# Patient Record
Sex: Male | Born: 1985 | Race: Black or African American | Hispanic: No | Marital: Single | Smoking: Current every day smoker
Health system: Southern US, Community
[De-identification: ages and names within clinical notes are randomized; demographics above are authoritative.]

## PROBLEM LIST (undated history)

## (undated) HISTORY — PX: TESTICLE REMOVAL: SHX68

---
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
## 2007-10-20 ENCOUNTER — Emergency Department: Payer: Self-pay | Admitting: Emergency Medicine

## 2008-08-04 ENCOUNTER — Ambulatory Visit: Payer: Self-pay | Admitting: Urology

## 2009-08-04 ENCOUNTER — Emergency Department: Payer: Self-pay | Admitting: Emergency Medicine

## 2009-08-07 ENCOUNTER — Emergency Department: Payer: Self-pay | Admitting: Emergency Medicine

## 2010-05-29 ENCOUNTER — Emergency Department: Payer: Self-pay | Admitting: Emergency Medicine

## 2010-08-17 ENCOUNTER — Emergency Department: Payer: Self-pay | Admitting: Emergency Medicine

## 2011-07-13 ENCOUNTER — Emergency Department: Payer: Self-pay | Admitting: *Deleted

## 2011-07-13 LAB — COMPREHENSIVE METABOLIC PANEL
Albumin: 4.1 g/dL (ref 3.4–5.0)
Alkaline Phosphatase: 91 U/L (ref 50–136)
BUN: 15 mg/dL (ref 7–18)
Calcium, Total: 8.8 mg/dL (ref 8.5–10.1)
Chloride: 106 mmol/L (ref 98–107)
Creatinine: 1.2 mg/dL (ref 0.60–1.30)
Glucose: 100 mg/dL — ABNORMAL HIGH (ref 65–99)
Potassium: 3.7 mmol/L (ref 3.5–5.1)
SGOT(AST): 24 U/L (ref 15–37)
Total Protein: 7.7 g/dL (ref 6.4–8.2)

## 2011-07-13 LAB — URINALYSIS, COMPLETE
Bacteria: NONE SEEN
Bilirubin,UR: NEGATIVE
Glucose,UR: NEGATIVE mg/dL (ref 0–75)
Leukocyte Esterase: NEGATIVE
Nitrite: NEGATIVE
Ph: 5 (ref 4.5–8.0)
RBC,UR: 1 /HPF (ref 0–5)
Squamous Epithelial: <1

## 2011-07-13 LAB — CBC
MCH: 26.5 pg (ref 26.0–34.0)
MCHC: 33.1 g/dL (ref 32.0–36.0)
MCV: 80 fL (ref 80–100)
Platelet: 382 10*3/uL (ref 150–440)
RBC: 4.94 10*6/uL (ref 4.40–5.90)
RDW: 13.4 % (ref 11.5–14.5)
WBC: 9.8 10*3/uL (ref 3.8–10.6)

## 2012-11-29 ENCOUNTER — Emergency Department: Payer: Self-pay | Admitting: Emergency Medicine

## 2012-11-29 LAB — CBC WITH DIFFERENTIAL/PLATELET
Basophil #: 0.2 10*3/uL — ABNORMAL HIGH (ref 0.0–0.1)
Basophil %: 1.9 %
HCT: 40.4 % (ref 40.0–52.0)
HGB: 13.7 g/dL (ref 13.0–18.0)
Lymphocyte #: 2.4 10*3/uL (ref 1.0–3.6)
MCH: 26.7 pg (ref 26.0–34.0)
MCHC: 33.9 g/dL (ref 32.0–36.0)
MCV: 79 fL — ABNORMAL LOW (ref 80–100)
Monocyte #: 1.1 x10 3/mm — ABNORMAL HIGH (ref 0.2–1.0)
Neutrophil #: 4.5 10*3/uL (ref 1.4–6.5)
Neutrophil %: 53 %
RDW: 13.2 % (ref 11.5–14.5)
WBC: 8.5 10*3/uL (ref 3.8–10.6)

## 2012-11-29 LAB — URINALYSIS, COMPLETE
Bilirubin,UR: NEGATIVE
Blood: NEGATIVE
Glucose,UR: NEGATIVE mg/dL (ref 0–75)
Leukocyte Esterase: NEGATIVE
Nitrite: NEGATIVE
Ph: 5 (ref 4.5–8.0)
RBC,UR: 1 /HPF (ref 0–5)
Specific Gravity: 1.023 (ref 1.003–1.030)
WBC UR: 2 /HPF (ref 0–5)

## 2012-11-29 LAB — COMPREHENSIVE METABOLIC PANEL
Albumin: 3.8 g/dL (ref 3.4–5.0)
Alkaline Phosphatase: 93 U/L
Anion Gap: 6 — ABNORMAL LOW (ref 7–16)
Bilirubin,Total: 0.5 mg/dL (ref 0.2–1.0)
Calcium, Total: 9.2 mg/dL (ref 8.5–10.1)
Chloride: 107 mmol/L (ref 98–107)
EGFR (African American): >60
EGFR (Non-African Amer.): >60
Osmolality: 277 (ref 275–301)
Potassium: 4.5 mmol/L (ref 3.5–5.1)
SGPT (ALT): 42 U/L (ref 12–78)

## 2013-01-18 ENCOUNTER — Emergency Department: Payer: Self-pay | Admitting: Emergency Medicine

## 2013-01-18 LAB — TROPONIN I: Troponin-I: <0.02 ng/mL

## 2013-01-18 LAB — COMPREHENSIVE METABOLIC PANEL WITH GFR
Albumin: 3.8 g/dL
Alkaline Phosphatase: 89 U/L
Anion Gap: 6 — ABNORMAL LOW
BUN: 18 mg/dL
Bilirubin,Total: 0.6 mg/dL
Calcium, Total: 9 mg/dL
Chloride: 107 mmol/L
Co2: 24 mmol/L
Creatinine: 0.98 mg/dL
EGFR (African American): >60
EGFR (Non-African Amer.): >60
Glucose: 105 mg/dL — ABNORMAL HIGH
Osmolality: 276
Potassium: 3.8 mmol/L
SGOT(AST): 27 U/L
SGPT (ALT): 38 U/L
Sodium: 137 mmol/L
Total Protein: 7.5 g/dL

## 2013-01-18 LAB — CBC WITH DIFFERENTIAL/PLATELET
BASOS PCT: 1.1 %
Basophil #: 0.1 10*3/uL (ref 0.0–0.1)
Eosinophil #: 0.2 10*3/uL (ref 0.0–0.7)
Eosinophil %: 2.8 %
HCT: 42.5 % (ref 40.0–52.0)
HGB: 14.2 g/dL (ref 13.0–18.0)
Lymphocyte #: 2 10*3/uL (ref 1.0–3.6)
Lymphocyte %: 27 %
MCH: 26 pg (ref 26.0–34.0)
MCHC: 33.4 g/dL (ref 32.0–36.0)
MCV: 78 fL — ABNORMAL LOW (ref 80–100)
MONOS PCT: 17.8 %
Monocyte #: 1.3 x10 3/mm — ABNORMAL HIGH (ref 0.2–1.0)
NEUTROS ABS: 3.7 10*3/uL (ref 1.4–6.5)
Neutrophil %: 51.3 %
PLATELETS: 369 10*3/uL (ref 150–440)
RBC: 5.45 10*6/uL (ref 4.40–5.90)
RDW: 13.2 % (ref 11.5–14.5)
WBC: 7.2 10*3/uL (ref 3.8–10.6)

## 2013-01-18 LAB — URINALYSIS, COMPLETE
BILIRUBIN, UR: NEGATIVE
BLOOD: NEGATIVE
Bacteria: NONE SEEN
GLUCOSE, UR: NEGATIVE mg/dL (ref 0–75)
Ketone: NEGATIVE
Leukocyte Esterase: NEGATIVE
Nitrite: NEGATIVE
PH: 5 (ref 4.5–8.0)
PROTEIN: NEGATIVE
RBC,UR: 1 /HPF (ref 0–5)
Specific Gravity: 1.025 (ref 1.003–1.030)
Squamous Epithelial: NONE SEEN

## 2013-01-18 LAB — LIPASE, BLOOD: Lipase: 126 U/L (ref 73–393)

## 2013-07-27 ENCOUNTER — Emergency Department: Payer: Self-pay | Admitting: Emergency Medicine

## 2013-07-27 LAB — COMPREHENSIVE METABOLIC PANEL
ALBUMIN: 3.9 g/dL (ref 3.4–5.0)
ANION GAP: 9 (ref 7–16)
Alkaline Phosphatase: 85 U/L
BUN: 12 mg/dL (ref 7–18)
Bilirubin,Total: 0.6 mg/dL (ref 0.2–1.0)
CALCIUM: 9 mg/dL (ref 8.5–10.1)
CHLORIDE: 104 mmol/L (ref 98–107)
CO2: 25 mmol/L (ref 21–32)
Creatinine: 1.28 mg/dL (ref 0.60–1.30)
EGFR (African American): >60
EGFR (Non-African Amer.): >60
Glucose: 109 mg/dL — ABNORMAL HIGH (ref 65–99)
OSMOLALITY: 276 (ref 275–301)
Potassium: 4.1 mmol/L (ref 3.5–5.1)
SGOT(AST): 33 U/L (ref 15–37)
SGPT (ALT): 45 U/L
SODIUM: 138 mmol/L (ref 136–145)
Total Protein: 8.4 g/dL — ABNORMAL HIGH (ref 6.4–8.2)

## 2013-07-27 LAB — CBC WITH DIFFERENTIAL/PLATELET
BASOS ABS: 0.1 10*3/uL (ref 0.0–0.1)
Basophil %: 1.3 %
EOS ABS: 0.2 10*3/uL (ref 0.0–0.7)
Eosinophil %: 3.3 %
HCT: 46.7 % (ref 40.0–52.0)
HGB: 15 g/dL (ref 13.0–18.0)
LYMPHS PCT: 37.7 %
Lymphocyte #: 2.6 10*3/uL (ref 1.0–3.6)
MCH: 25.7 pg — ABNORMAL LOW (ref 26.0–34.0)
MCHC: 32.2 g/dL (ref 32.0–36.0)
MCV: 80 fL (ref 80–100)
Monocyte #: 1 x10 3/mm (ref 0.2–1.0)
Monocyte %: 14 %
Neutrophil #: 3 10*3/uL (ref 1.4–6.5)
Neutrophil %: 43.7 %
PLATELETS: 470 10*3/uL — AB (ref 150–440)
RBC: 5.86 10*6/uL (ref 4.40–5.90)
RDW: 13.3 % (ref 11.5–14.5)
WBC: 6.9 10*3/uL (ref 3.8–10.6)

## 2013-07-27 LAB — URINALYSIS, COMPLETE
BACTERIA: NONE SEEN
BLOOD: NEGATIVE
Bilirubin,UR: NEGATIVE
Glucose,UR: NEGATIVE mg/dL (ref 0–75)
KETONE: NEGATIVE
Leukocyte Esterase: NEGATIVE
NITRITE: NEGATIVE
PH: 5 (ref 4.5–8.0)
PROTEIN: NEGATIVE
Specific Gravity: 1.025 (ref 1.003–1.030)
Squamous Epithelial: NONE SEEN
WBC UR: 24 /HPF (ref 0–5)

## 2013-07-27 LAB — LIPASE, BLOOD: Lipase: 139 U/L (ref 73–393)

## 2014-04-30 ENCOUNTER — Emergency Department: Admit: 2014-04-30 | Disposition: A | Discharge status: Home / Self Care | Payer: Self-pay | Admitting: Internal Medicine

## 2014-04-30 LAB — COMPREHENSIVE METABOLIC PANEL
ALBUMIN: 4.4 g/dL
ANION GAP: 9 (ref 7–16)
AST: 27 U/L
Alkaline Phosphatase: 85 U/L
BUN: 8 mg/dL
Bilirubin,Total: 0.8 mg/dL
CALCIUM: 9 mg/dL
CO2: 25 mmol/L
Chloride: 105 mmol/L
Creatinine: 0.98 mg/dL
EGFR (Non-African Amer.): >60
GLUCOSE: 108 mg/dL — AB
Potassium: 3.9 mmol/L
SGPT (ALT): 41 U/L
Sodium: 139 mmol/L
Total Protein: 7.6 g/dL

## 2014-04-30 LAB — CBC WITH DIFFERENTIAL/PLATELET
BASOS ABS: 0.1 10*3/uL (ref 0.0–0.1)
Basophil %: 0.7 %
Eosinophil #: 0.1 10*3/uL (ref 0.0–0.7)
Eosinophil %: 1.6 %
HCT: 43.2 % (ref 40.0–52.0)
HGB: 14.2 g/dL (ref 13.0–18.0)
LYMPHS PCT: 24.3 %
Lymphocyte #: 2.1 10*3/uL (ref 1.0–3.6)
MCH: 25.5 pg — ABNORMAL LOW (ref 26.0–34.0)
MCHC: 33 g/dL (ref 32.0–36.0)
MCV: 77 fL — ABNORMAL LOW (ref 80–100)
Monocyte #: 1.2 x10 3/mm — ABNORMAL HIGH (ref 0.2–1.0)
Monocyte %: 14.4 %
NEUTROS ABS: 5 10*3/uL (ref 1.4–6.5)
Neutrophil %: 59 %
Platelet: 390 10*3/uL (ref 150–440)
RBC: 5.57 10*6/uL (ref 4.40–5.90)
RDW: 13.5 % (ref 11.5–14.5)
WBC: 8.4 10*3/uL (ref 3.8–10.6)

## 2014-05-02 LAB — BETA STREP CULTURE(ARMC)

## 2014-09-16 ENCOUNTER — Emergency Department
Admission: EM | Admit: 2014-09-16 | Discharge: 2014-09-16 | Disposition: A | Discharge status: Home / Self Care | Payer: Self-pay | Attending: Emergency Medicine | Admitting: Emergency Medicine

## 2014-09-16 ENCOUNTER — Encounter: Payer: Self-pay | Admitting: Emergency Medicine

## 2014-09-16 ENCOUNTER — Emergency Department: Payer: Self-pay

## 2014-09-16 DIAGNOSIS — Z720 Tobacco use: Secondary | ICD-10-CM | POA: Insufficient documentation

## 2014-09-16 DIAGNOSIS — Y998 Other external cause status: Secondary | ICD-10-CM | POA: Insufficient documentation

## 2014-09-16 DIAGNOSIS — S8001XA Contusion of right knee, initial encounter: Secondary | ICD-10-CM

## 2014-09-16 DIAGNOSIS — Y9289 Other specified places as the place of occurrence of the external cause: Secondary | ICD-10-CM | POA: Insufficient documentation

## 2014-09-16 DIAGNOSIS — Y9389 Activity, other specified: Secondary | ICD-10-CM | POA: Insufficient documentation

## 2014-09-16 MED ORDER — HYDROMORPHONE HCL 1 MG/ML IJ SOLN
1.0000 mg | Freq: Once | INTRAMUSCULAR | Status: AC
  Administered 2014-09-16: 1 mg via INTRAVENOUS
  Filled 2014-09-16: qty 1

## 2014-09-16 MED ORDER — ONDANSETRON HCL 4 MG/2ML IJ SOLN
4.0000 mg | Freq: Once | INTRAMUSCULAR | Status: AC
  Administered 2014-09-16: 4 mg via INTRAVENOUS
  Filled 2014-09-16: qty 2

## 2014-09-16 MED ORDER — NAPROXEN 500 MG PO TBEC: 500.0000 mg | DELAYED_RELEASE_TABLET | Freq: Two times a day (BID) | ORAL | Status: DC

## 2014-09-16 MED ORDER — HYDROCODONE-ACETAMINOPHEN 5-325 MG PO TABS: 1.0000 | ORAL_TABLET | ORAL | Status: DC | PRN

## 2014-09-16 NOTE — Discharge Instructions (Signed)
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
## 2014-09-30 ENCOUNTER — Emergency Department: Payer: Self-pay

## 2014-09-30 ENCOUNTER — Encounter: Payer: Self-pay | Admitting: Emergency Medicine

## 2014-09-30 ENCOUNTER — Emergency Department
Admission: EM | Admit: 2014-09-30 | Discharge: 2014-09-30 | Disposition: A | Discharge status: Home / Self Care | Payer: Self-pay | Attending: Emergency Medicine | Admitting: Emergency Medicine

## 2014-09-30 DIAGNOSIS — M79606 Pain in leg, unspecified: Secondary | ICD-10-CM

## 2014-09-30 DIAGNOSIS — M25461 Effusion, right knee: Secondary | ICD-10-CM | POA: Insufficient documentation

## 2014-09-30 DIAGNOSIS — Z720 Tobacco use: Secondary | ICD-10-CM | POA: Insufficient documentation

## 2014-09-30 DIAGNOSIS — M79661 Pain in right lower leg: Secondary | ICD-10-CM | POA: Insufficient documentation

## 2014-09-30 DIAGNOSIS — M79604 Pain in right leg: Secondary | ICD-10-CM

## 2014-09-30 DIAGNOSIS — M25561 Pain in right knee: Secondary | ICD-10-CM | POA: Insufficient documentation

## 2014-09-30 MED ORDER — METHOCARBAMOL 500 MG PO TABS: 500.0000 mg | ORAL_TABLET | Freq: Four times a day (QID) | ORAL | Status: DC | PRN

## 2014-09-30 MED ORDER — DICLOFENAC SODIUM 75 MG PO TBEC: 75.0000 mg | DELAYED_RELEASE_TABLET | Freq: Two times a day (BID) | ORAL | Status: DC

## 2014-09-30 NOTE — ED Notes (Signed)
Pt reports pain and swelling to his right knee. Pt reports was seen here for the same and told he had fluid on his knee. Pt reports felt better after that but now sx's are back. Pt admits they he did not follow up.
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
## 2014-09-30 NOTE — Discharge Instructions (Signed)
Heat Therapy Heat therapy can help make painful, stiff muscles and joints feel better. Do not use heat on new injuries. Wait at least 48 hours after an injury to use heat. Do not use heat when you have aches or pains right after an activity. If you still have pain 3 hours after stopping the activity, then you may use heat. HOME CARE Wet heat pack  Soak a clean towel in warm water. Squeeze out the extra water.  Put the warm, wet towel in a plastic bag.  Place a thin, dry towel between your skin and the bag.  Put the heat pack on the area for 5 minutes, and check your skin. Your skin may be pink, but it should not be red.  Leave the heat pack on the area for 15 to 30 minutes.  Repeat this every 2 to 4 hours while awake. Do not use heat while you are sleeping. Warm water bath  Fill a tub with warm water.  Place the affected body part in the tub.  Soak the area for 20 to 40 minutes.  Repeat as needed. Hot water bottle  Fill the water bottle half full with hot water.  Press out the extra air. Close the cap tightly.  Place a dry towel between your skin and the bottle.  Put the bottle on the area for 5 minutes, and check your skin. Your skin may be pink, but it should not be red.  Leave the bottle on the area for 15 to 30 minutes.  Repeat this every 2 to 4 hours while awake. Electric heating pad  Place a dry towel between your skin and the heating pad.  Set the heating pad on low heat.  Put the heating pad on the area for 10 minutes, and check your skin. Your skin may be pink, but it should not be red.  Leave the heating pad on the area for 20 to 40 minutes.  Repeat this every 2 to 4 hours while awake.  Do not lie on the heating pad.  Do not fall asleep while using the heating pad.  Do not use the heating pad near water. GET HELP RIGHT AWAY IF:  You get blisters or red skin.  Your skin is puffy (swollen), or you lose feeling (numbness) in the affected area.  You  have any new problems.  Your problems are getting worse.  You have any questions or concerns. If you have any problems, stop using heat therapy until you see your doctor. MAKE SURE YOU:  Understand these instructions.  Will watch your condition.  Will get help right away if you are not doing well or get worse. Document Released: 03/15/2011 Document Reviewed: 02/13/2013 [facility] Patient Information 2015 [place], [place]. This information is not intended to replace advice given to you by your health care provider. Make sure you discuss any questions you have with your health care provider.  Intermittent Claudication Blockage of leg arteries results from poor circulation of blood in the leg arteries. This produces an aching, tired, and sometimes burning pain in the legs that is brought on by exercise and made better by rest. Claudication refers to the limping that happens from leg cramps. It is also referred to as Vaso-occlusive disease of the legs, arterial insufficiency of the legs, recurrent leg pain, recurrent leg cramping and calf pain with exercise.  CAUSES  This condition is due to narrowing or blockage of the arteries (muscular vessels which carry blood away from the heart  and around the body). Blockage of arteries can occur anywhere in the body. If they occur in the heart, a person may experience angina (chest pain) or even a heart attack. If they occur in the neck or the brain, a person may have a stroke. Intermittent claudication is when the blockage occurs in the legs, most commonly in the calf or the foot.  Atherosclerosis, or blockage of arteries, can occur for many reasons. Some of these are smoking, diabetes, and high cholesterol. SYMPTOMS  Intermittent claudication may occur in both legs, and it often continues to get worse over time. However, some people complain only of weakness in the legs when walking, or a feeling of "tiredness" in the buttocks. Impotence (not able to have  an erection) is an occasional complaint in men. Pain while resting is uncommon.  WHAT TO EXPECT AT [facility] PROVIDER'S OFFICE: Your medical history will be asked for and a physical examination will be performed. Medical history questions documenting claudication in detail may include:   Time pattern  Do you have leg cramps at night (nocturnal cramps)?  How often does leg pain with cramping occur?  Is it getting worse?  What is the quality of the pain?  Is the pain sharp?  Is there an aching pain with the cramps?  Aggravating factors  Is it worse after you exercise?  Is it worse after you are standing for a while?  Do you smoke? How much?  Do you drink alcohol? How much?  Are you diabetic? How well is your blood sugar controlled?  Other  What other symptoms are also present?  Has there been impotence (men)?  Is there pain in the back?  Is there a darkening of the skin of the legs, feet or toes?  Is there weakness or paralysis of the legs? The physical examination may include evaluation of the femoral pulse (in the groin) and the other areas where the pulse can be felt in the legs. DIAGNOSIS  Diagnostic tests that may be performed include:  Blood pressure measured in arms and legs for comparison.  Doppler ultrasonography on the legs and the heart.  Duplex Doppler/ultrasound exam of extremity to visualize arterial blood flow.  ECG- to evaluate the activity of your heart.  Aortography- to visualize blockages in your arteries. TREATMENT Surgical treatment may be suggested if claudication interferes with the patient's activities or work, and if the diseased arteries do not seem to be improving after treatment. Be aware that this condition can worsen over time and you should carefully monitor your condition. HOME CARE INSTRUCTIONS  Talk to your caregiver about the cause of your leg cramping and about what to do at home to relieve it.  A healthy diet is  important to lessen the likeliness of atherosclerosis.  A program of daily walking for short periods, and stopping for pain or cramping, may help improve function.  It is important to stop smoking.  Avoid putting hot or cold items on legs.  Avoid tight shoes. SEEK MEDICAL CARE IF: There are many other causes of leg pain such as arthritis or low blood potassium. However, some causes of leg pain may be life threatening such as a blood clot in the legs. Seek medical attention if you have:  Leg pain that does not go away.  Legs that may be red, hot or swollen.  Ulcers or sores appear on your ankle or foot.  Any chest pain or shortness of breath accompanying leg pain.  Diabetes.  You are pregnant. SEEK IMMEDIATE MEDICAL CARE IF:   Your leg pain becomes severe or will not go away.  Your foot turns blue or a dark color.  Your leg becomes red, hot or swollen or you develop a fever over 102F.  Any chest pain or shortness of breath accompanying leg pain. MAKE SURE YOU:   Understand these instructions.  Will watch your condition.  Will get help right away if you are not doing well or get worse. Document Released: 10/24/2003 Document Revised: 03/15/2011 Document Reviewed: 03/29/2013 [facility] Patient Information 2015 [place], [place]. This information is not intended to replace advice given to you by your health care provider. Make sure you discuss any questions you have with your health care provider.  Musculoskeletal Pain Musculoskeletal pain is muscle and boney aches and pains. These pains can occur in any part of the body. Your caregiver may treat you without knowing the cause of the pain. They may treat you if blood or urine tests, X-rays, and other tests were normal.  CAUSES There is often not a definite cause or reason for these pains. These pains may be caused by a type of germ (virus). The discomfort may also come from overuse. Overuse includes working out too hard when your  body is not fit. Boney aches also come from weather changes. Bone is sensitive to atmospheric pressure changes. HOME CARE INSTRUCTIONS   Ask when your test results will be ready. Make sure you get your test results.  Only take over-the-counter or prescription medicines for pain, discomfort, or fever as directed by your caregiver. If you were given medications for your condition, do not drive, operate machinery or power tools, or sign legal documents for 24 hours. Do not drink alcohol. Do not take sleeping pills or other medications that may interfere with treatment.  Continue all activities unless the activities cause more pain. When the pain lessens, slowly resume normal activities. Gradually increase the intensity and duration of the activities or exercise.  During periods of severe pain, bed rest may be helpful. Lay or sit in any position that is comfortable.  Putting ice on the injured area.  Put ice in a bag.  Place a towel between your skin and the bag.  Leave the ice on for 15 to 20 minutes, 3 to 4 times a day.  Follow up with your caregiver for continued problems and no reason can be found for the pain. If the pain becomes worse or does not go away, it may be necessary to repeat tests or do additional testing. Your caregiver may need to look further for a possible cause. SEEK IMMEDIATE MEDICAL CARE IF:  You have pain that is getting worse and is not relieved by medications.  You develop chest pain that is associated with shortness or breath, sweating, feeling sick to your stomach (nauseous), or throw up (vomit).  Your pain becomes localized to the abdomen.  You develop any new symptoms that seem different or that concern you. MAKE SURE YOU:   Understand these instructions.  Will watch your condition.  Will get help right away if you are not doing well or get worse. Document Released: 12/21/2004 Document Revised: 03/15/2011 Document Reviewed: 08/25/2012 [facility] Patient  Information 2015 [place], [place]. This information is not intended to replace advice given to you by your health care provider. Make sure you discuss any questions you have with your health care provider.
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
## 2015-07-09 ENCOUNTER — Emergency Department: Payer: Self-pay

## 2015-07-09 ENCOUNTER — Emergency Department
Admission: EM | Admit: 2015-07-09 | Discharge: 2015-07-09 | Disposition: A | Discharge status: Home / Self Care | Payer: Self-pay | Attending: Emergency Medicine | Admitting: Emergency Medicine

## 2015-07-09 DIAGNOSIS — Y929 Unspecified place or not applicable: Secondary | ICD-10-CM | POA: Insufficient documentation

## 2015-07-09 DIAGNOSIS — F17200 Nicotine dependence, unspecified, uncomplicated: Secondary | ICD-10-CM | POA: Insufficient documentation

## 2015-07-09 DIAGNOSIS — Z8719 Personal history of other diseases of the digestive system: Secondary | ICD-10-CM | POA: Insufficient documentation

## 2015-07-09 DIAGNOSIS — S39012A Strain of muscle, fascia and tendon of lower back, initial encounter: Secondary | ICD-10-CM | POA: Insufficient documentation

## 2015-07-09 DIAGNOSIS — Y939 Activity, unspecified: Secondary | ICD-10-CM | POA: Insufficient documentation

## 2015-07-09 DIAGNOSIS — Y999 Unspecified external cause status: Secondary | ICD-10-CM | POA: Insufficient documentation

## 2015-07-09 DIAGNOSIS — Z791 Long term (current) use of non-steroidal anti-inflammatories (NSAID): Secondary | ICD-10-CM | POA: Insufficient documentation

## 2015-07-09 DIAGNOSIS — X58XXXA Exposure to other specified factors, initial encounter: Secondary | ICD-10-CM | POA: Insufficient documentation

## 2015-07-09 LAB — URINALYSIS COMPLETE WITH MICROSCOPIC (ARMC ONLY)
Bacteria, UA: NONE SEEN
Bilirubin Urine: NEGATIVE
Glucose, UA: NEGATIVE mg/dL
Hgb urine dipstick: NEGATIVE
KETONES UR: NEGATIVE mg/dL
Leukocytes, UA: NEGATIVE
Nitrite: NEGATIVE
PROTEIN: NEGATIVE mg/dL
SPECIFIC GRAVITY, URINE: 1.016 (ref 1.005–1.030)
SQUAMOUS EPITHELIAL / LPF: NONE SEEN
pH: 5 (ref 5.0–8.0)

## 2015-07-09 LAB — COMPREHENSIVE METABOLIC PANEL
ALK PHOS: 64 U/L (ref 38–126)
ALT: 20 U/L (ref 17–63)
ANION GAP: 5 (ref 5–15)
AST: 18 U/L (ref 15–41)
Albumin: 4.1 g/dL (ref 3.5–5.0)
BILIRUBIN TOTAL: 0.6 mg/dL (ref 0.3–1.2)
BUN: 15 mg/dL (ref 6–20)
CO2: 26 mmol/L (ref 22–32)
Calcium: 9.1 mg/dL (ref 8.9–10.3)
Chloride: 106 mmol/L (ref 101–111)
Creatinine, Ser: 1.02 mg/dL (ref 0.61–1.24)
GLUCOSE: 93 mg/dL (ref 65–99)
POTASSIUM: 3.9 mmol/L (ref 3.5–5.1)
Sodium: 137 mmol/L (ref 135–145)
Total Protein: 6.8 g/dL (ref 6.5–8.1)

## 2015-07-09 MED ORDER — KETOROLAC TROMETHAMINE 60 MG/2ML IM SOLN
30.0000 mg | Freq: Once | INTRAMUSCULAR | Status: AC
  Administered 2015-07-09: 30 mg via INTRAMUSCULAR
  Filled 2015-07-09: qty 2

## 2015-07-09 MED ORDER — KETOROLAC TROMETHAMINE 10 MG PO TABS: 10.0000 mg | ORAL_TABLET | Freq: Three times a day (TID) | ORAL | Status: DC

## 2015-07-09 MED ORDER — CYCLOBENZAPRINE HCL 5 MG PO TABS: 5.0000 mg | ORAL_TABLET | Freq: Three times a day (TID) | ORAL | Status: DC | PRN

## 2015-07-09 MED ORDER — CYCLOBENZAPRINE HCL 10 MG PO TABS
10.0000 mg | ORAL_TABLET | Freq: Once | ORAL | Status: AC
  Administered 2015-07-09: 10 mg via ORAL
  Filled 2015-07-09: qty 1

## 2015-07-09 NOTE — ED Notes (Signed)
Pt presents with bilateral lower back pain x 3-4 weeks.Pt denies denies any known trauma or injury, states it just started hurting one day about 3 1/2 weeks ago when he "stepped out of bed and it just started hurting". Pt denies any loss or motor function or sensation i the lower extremities. Pt denies any change in bowel or bladder habits, but does mention he noticed some blood in his stool 4 days ago.

## 2015-07-09 NOTE — Discharge Instructions (Signed)
Take the anti-inflammatory & muscle relaxant medicines as directed. Apply ice to reduce symptoms. Follow-up with [facility] for ongoing management. Consider seeing Dr. Vira Agar for issues related to your stomach and colon (rectum).   Back Injury Prevention Back injuries can be very painful. They can also be difficult to heal. After having one back injury, you are more likely to injure your back again. It is important to learn how to avoid injuring or re-injuring your back. The following tips can help you to prevent a back injury. WHAT SHOULD I KNOW ABOUT PHYSICAL FITNESS?  Exercise for 30 minutes per day on most days of the week or as directed by your health care provider. Make sure to:  Do aerobic exercises, such as walking, jogging, biking, or swimming.  Do exercises that increase balance and strength, such as tai chi and yoga. These can decrease your risk of falling and injuring your back.  Do stretching exercises to help with flexibility.  Try to develop strong abdominal muscles. Your abdominal muscles provide a lot of the support that is needed by your back.  Maintain a healthy weight. This helps to decrease your risk of a back injury. WHAT SHOULD I KNOW ABOUT MY DIET?  Talk with your health care provider about your overall diet. Take supplements and vitamins only as directed by your health care provider.  Talk with your health care provider about how much calcium and vitamin D you need each day. These nutrients help to prevent weakening of the bones (osteoporosis). Osteoporosis can cause broken (fractured) bones, which lead to back pain.  Include good sources of calcium in your diet, such as dairy products, green leafy vegetables, and products that have had calcium added to them (fortified).  Include good sources of vitamin D in your diet, such as milk and foods that are fortified with vitamin D. WHAT SHOULD I KNOW ABOUT MY POSTURE?  Sit up straight and stand up  straight. Avoid leaning forward when you sit or hunching over when you stand.  Choose chairs that have good low-back (lumbar) support.  If you work at a desk, sit close to it so you do not need to lean over. Keep your chin tucked in. Keep your neck drawn back, and keep your elbows bent at a right angle. Your arms should look like the letter "L."  Sit high and close to the steering wheel when you drive. Add a lumbar support to your car seat, if needed.  Avoid sitting or standing in one position for very long. Take breaks to get up, stretch, and walk around at least one time every hour. Take breaks every hour if you are driving for long periods of time.  Sleep on your side with your knees slightly bent, or sleep on your back with a pillow under your knees. Do not lie on the front of your body to sleep. WHAT SHOULD I KNOW ABOUT LIFTING, TWISTING, AND REACHING? Lifting and Heavy Lifting  Avoid heavy lifting, especially repetitive heavy lifting. If you must do heavy lifting:  Stretch before lifting.  Work slowly.  Rest between lifts.  Use a tool such as a cart or a dolly to move objects if one is available.  Make several small trips instead of carrying one heavy load.  Ask for help when you need it, especially when moving big objects.  Follow these steps when lifting:  Stand with your feet shoulder-width apart.  Get as close to the object as you can. Do not  try to pick up a heavy object that is far from your body.  Use handles or lifting straps if they are available.  Bend at your knees. Squat down, but keep your heels off the floor.  Keep your shoulders pulled back, your chin tucked in, and your back straight.  Lift the object slowly while you tighten the muscles in your legs, abdomen, and buttocks. Keep the object as close to the center of your body as possible.  Follow these steps when putting down a heavy load:  Stand with your feet shoulder-width apart.  Lower the object  slowly while you tighten the muscles in your legs, abdomen, and buttocks. Keep the object as close to the center of your body as possible.  Keep your shoulders pulled back, your chin tucked in, and your back straight.  Bend at your knees. Squat down, but keep your heels off the floor.  Use handles or lifting straps if they are available. Twisting and Reaching  Avoid lifting heavy objects above your waist.  Do not twist at your waist while you are lifting or carrying a load. If you need to turn, move your feet.  Do not bend over without bending at your knees.  Avoid reaching over your head, across a table, or for an object on a high surface. WHAT ARE SOME OTHER TIPS?  Avoid wet floors and icy ground. Keep sidewalks clear of ice to prevent falls.  Do not sleep on a mattress that is too soft or too hard.  Keep items that are used frequently within easy reach.  Put heavier objects on shelves at waist level, and put lighter objects on lower or higher shelves.  Find ways to decrease your stress, such as exercise, massage, or relaxation techniques. Stress can build up in your muscles. Tense muscles are more vulnerable to injury.  Talk with your health care provider if you feel anxious or depressed. These conditions can make back pain worse.  Wear flat heel shoes with cushioned soles.  Avoid sudden movements.  Use both shoulder straps when carrying a backpack.  Do not use any tobacco products, including cigarettes, chewing tobacco, or electronic cigarettes. If you need help quitting, ask your health care provider.   This information is not intended to replace advice given to you by your health care provider. Make sure you discuss any questions you have with your health care provider.   Document Released: 01/29/2004 Document Revised: 05/07/2014 Document Reviewed: 12/25/2013 Elsevier Interactive Patient Education 2016 [place] Injury Prevention Back injuries can be very  painful. They can also be difficult to heal. After having one back injury, you are more likely to injure your back again. It is important to learn how to avoid injuring or re-injuring your back. The following tips can help you to prevent a back injury. WHAT SHOULD I KNOW ABOUT PHYSICAL FITNESS?  Exercise for 30 minutes per day on most days of the week or as directed by your health care provider. Make sure to:  Do aerobic exercises, such as walking, jogging, biking, or swimming.  Do exercises that increase balance and strength, such as tai chi and yoga. These can decrease your risk of falling and injuring your back.  Do stretching exercises to help with flexibility.  Try to develop strong abdominal muscles. Your abdominal muscles provide a lot of the support that is needed by your back.  Maintain a healthy weight. This helps to decrease your risk of a back injury. WHAT SHOULD  I KNOW ABOUT MY DIET?  Talk with your health care provider about your overall diet. Take supplements and vitamins only as directed by your health care provider.  Talk with your health care provider about how much calcium and vitamin D you need each day. These nutrients help to prevent weakening of the bones (osteoporosis). Osteoporosis can cause broken (fractured) bones, which lead to back pain.  Include good sources of calcium in your diet, such as dairy products, green leafy vegetables, and products that have had calcium added to them (fortified).  Include good sources of vitamin D in your diet, such as milk and foods that are fortified with vitamin D. WHAT SHOULD I KNOW ABOUT MY POSTURE?  Sit up straight and stand up straight. Avoid leaning forward when you sit or hunching over when you stand.  Choose chairs that have good low-back (lumbar) support.  If you work at a desk, sit close to it so you do not need to lean over. Keep your chin tucked in. Keep your neck drawn back, and keep your elbows bent at a right  angle. Your arms should look like the letter "L."  Sit high and close to the steering wheel when you drive. Add a lumbar support to your car seat, if needed.  Avoid sitting or standing in one position for very long. Take breaks to get up, stretch, and walk around at least one time every hour. Take breaks every hour if you are driving for long periods of time.  Sleep on your side with your knees slightly bent, or sleep on your back with a pillow under your knees. Do not lie on the front of your body to sleep. WHAT SHOULD I KNOW ABOUT LIFTING, TWISTING, AND REACHING? Lifting and Heavy Lifting  Avoid heavy lifting, especially repetitive heavy lifting. If you must do heavy lifting:  Stretch before lifting.  Work slowly.  Rest between lifts.  Use a tool such as a cart or a dolly to move objects if one is available.  Make several small trips instead of carrying one heavy load.  Ask for help when you need it, especially when moving big objects.  Follow these steps when lifting:  Stand with your feet shoulder-width apart.  Get as close to the object as you can. Do not try to pick up a heavy object that is far from your body.  Use handles or lifting straps if they are available.  Bend at your knees. Squat down, but keep your heels off the floor.  Keep your shoulders pulled back, your chin tucked in, and your back straight.  Lift the object slowly while you tighten the muscles in your legs, abdomen, and buttocks. Keep the object as close to the center of your body as possible.  Follow these steps when putting down a heavy load:  Stand with your feet shoulder-width apart.  Lower the object slowly while you tighten the muscles in your legs, abdomen, and buttocks. Keep the object as close to the center of your body as possible.  Keep your shoulders pulled back, your chin tucked in, and your back straight.  Bend at your knees. Squat down, but keep your heels off the floor.  Use  handles or lifting straps if they are available. Twisting and Reaching  Avoid lifting heavy objects above your waist.  Do not twist at your waist while you are lifting or carrying a load. If you need to turn, move your feet.  Do not bend over without  bending at your knees.  Avoid reaching over your head, across a table, or for an object on a high surface. WHAT ARE SOME OTHER TIPS?  Avoid wet floors and icy ground. Keep sidewalks clear of ice to prevent falls.  Do not sleep on a mattress that is too soft or too hard.  Keep items that are used frequently within easy reach.  Put heavier objects on shelves at waist level, and put lighter objects on lower or higher shelves.  Find ways to decrease your stress, such as exercise, massage, or relaxation techniques. Stress can build up in your muscles. Tense muscles are more vulnerable to injury.  Talk with your health care provider if you feel anxious or depressed. These conditions can make back pain worse.  Wear flat heel shoes with cushioned soles.  Avoid sudden movements.  Use both shoulder straps when carrying a backpack.  Do not use any tobacco products, including cigarettes, chewing tobacco, or electronic cigarettes. If you need help quitting, ask your health care provider.   This information is not intended to replace advice given to you by your health care provider. Make sure you discuss any questions you have with your health care provider.   Document Released: 01/29/2004 Document Revised: 05/07/2014 Document Reviewed: 12/25/2013 Elsevier Interactive Patient Education 2016 [employer]  Lumbosacral Strain Lumbosacral strain is a strain of any of the parts that make up your lumbosacral vertebrae. Your lumbosacral vertebrae are the bones that make up the lower third of your backbone. Your lumbosacral vertebrae are held together by muscles and tough, fibrous tissue (ligaments).  CAUSES  A sudden blow to your back can cause  lumbosacral strain. Also, anything that causes an excessive stretch of the muscles in the low back can cause this strain. This is typically seen when people exert themselves strenuously, fall, lift heavy objects, bend, or crouch repeatedly. RISK FACTORS  Physically demanding work.  Participation in pushing or pulling sports or sports that require a sudden twist of the back (tennis, golf, baseball).  Weight lifting.  Excessive lower back curvature.  Forward-tilted pelvis.  Weak back or abdominal muscles or both.  Tight hamstrings. SIGNS AND SYMPTOMS  Lumbosacral strain may cause pain in the area of your injury or pain that moves (radiates) down your leg.  DIAGNOSIS Your health care provider can often diagnose lumbosacral strain through a physical exam. In some cases, you may need tests such as X-ray exams.  TREATMENT  Treatment for your lower back injury depends on many factors that your clinician will have to evaluate. However, most treatment will include the use of anti-inflammatory medicines. HOME CARE INSTRUCTIONS   Avoid hard physical activities (tennis, racquetball, waterskiing) if you are not in proper physical condition for it. This may aggravate or create problems.  If you have a back problem, avoid sports requiring sudden body movements. Swimming and walking are generally safer activities.  Maintain good posture.  Maintain a healthy weight.  For acute conditions, you may put ice on the injured area.  Put ice in a plastic bag.  Place a towel between your skin and the bag.  Leave the ice on for 20 minutes, 2-3 times a day.  When the low back starts healing, stretching and strengthening exercises may be recommended. SEEK MEDICAL CARE IF:  Your back pain is getting worse.  You experience severe back pain not relieved with medicines. SEEK IMMEDIATE MEDICAL CARE IF:   You have numbness, tingling, weakness, or problems with the use of your  arms or legs.  There is a  change in bowel or bladder control.  You have increasing pain in any area of the body, including your belly (abdomen).  You notice shortness of breath, dizziness, or feel faint.  You feel sick to your stomach (nauseous), are throwing up (vomiting), or become sweaty.  You notice discoloration of your toes or legs, or your feet get very cold. MAKE SURE YOU:   Understand these instructions.  Will watch your condition.  Will get help right away if you are not doing well or get worse.   This information is not intended to replace advice given to you by your health care provider. Make sure you discuss any questions you have with your health care provider.   Document Released: 09/30/2004 Document Revised: 01/11/2014 Document Reviewed: 08/09/2012 Elsevier Interactive Patient Education [employer].

## 2015-07-09 NOTE — ED Notes (Signed)
Pt in with co lower back pain x 3 weeks unsure of injury. Worse when he walks or moves, unable to bend over due to pain.

## 2015-07-09 NOTE — ED Provider Notes (Signed)
[facility] Emergency Department Provider Note ____________________________________________  Time seen: 1947  I have reviewed the triage vital signs and the nursing notes.  HISTORY  Chief Complaint  Back Pain  HPI Ricky PouDominique T Manning is a 30 y.o. male presents to the ED accompanied by his mother for evaluation of low back pain has been intermittent for the last 3 weeks. Patient denies any known injury, accident, or trauma. He also denies any previous history of low back pain. He describes the pain is worse with bending, and localizes the pain to the lumbar sacral region. He notes some increased tightness across the bilateral thighs and buttocks when he bends. He does work in a heavy labor job has been there for the last 2 weeks, but denies any job-related injury. He is utilized he therapy and ibuprofen intermittently without any significant or sustained relief. He denies any bladder or bowel incontinence, foot drop, leg weakness. He does relate a history of intermittent rectal bleeding without rectal pain. When asked about bladder or bowel incontinence, he reports bright red blood on the toilet tissue that has been intermittent for the last 4 years. He does report previous ED visits and workups but admits that he did not follow-up as prescribed. He rates his pain at a 10/10 in triage. He has not sought care any other clinical provider's office.  No past medical history on file.  There are no active problems to display for this patient.  Past Surgical History  Procedure Laterality Date  . Testicle removal      Current Outpatient Rx  Name  Route  Sig  Dispense  Refill  . cyclobenzaprine (FLEXERIL) 5 MG tablet   Oral   Take 1 tablet (5 mg total) by mouth 3 (three) times daily as needed for muscle spasms.   15 tablet   0   . diclofenac (VOLTAREN) 75 MG EC tablet   Oral   Take 1 tablet (75 mg total) by mouth 2 (two) times daily.   60 tablet   0   . ketorolac  (TORADOL) 10 MG tablet   Oral   Take 1 tablet (10 mg total) by mouth every 8 (eight) hours.   15 tablet   0   . methocarbamol (ROBAXIN) 500 MG tablet   Oral   Take 1 tablet (500 mg total) by mouth every 6 (six) hours as needed for muscle spasms.   30 tablet   0    Allergies Tramadol  No family history on file.  Social History Social History  Substance Use Topics  . Smoking status: Current Every Day Smoker  . Smokeless tobacco: Not on file  . Alcohol Use: Yes   Review of Systems  Constitutional: Negative for fever. Cardiovascular: Negative for chest pain. Respiratory: Negative for shortness of breath. Gastrointestinal: Negative for abdominal pain, vomiting and diarrhea. Genitourinary: Negative for dysuria. Musculoskeletal: Positive for back pain. Skin: Negative for rash. Neurological: Negative for headaches, focal weakness or numbness. ____________________________________________  PHYSICAL EXAM:  VITAL SIGNS: ED Triage Vitals  Enc Vitals Group     BP 07/09/15 1918 164/97 mmHg     Pulse Rate 07/09/15 1917 75     Resp 07/09/15 1917 20     Temp 07/09/15 1917 97.5 F (36.4 C)     Temp Source 07/09/15 1917 Oral     SpO2 07/09/15 1917 99 %     Weight 07/09/15 1917 263 lb (119.296 kg)     Height 07/09/15 1917 5\' 7"  (1.702  m)     Head Cir --      Peak Flow --      Pain Score 07/09/15 1917 10     Pain Loc --      Pain Edu? --      Excl. in GC? --    Constitutional: Alert and oriented. Well appearing and in no distress. Head: Normocephalic and atraumatic. Cardiovascular: Normal rate, regular rhythm.  Respiratory: Normal respiratory effort. No wheezes/rales/rhonchi. Gastrointestinal: Soft and nontender. No distention, rebound or guarding. No CVA tenderness Musculoskeletal:Normal spinal on it without midline tenderness, spasm, deformity, step-off. Patient transitions from supine to sit without difficulty. He has a negative seated straight leg raise. He is able to  perform a toe walk and heel without difficulty. Lumbar flexion and extension are full without difficulty. Nontender with normal range of motion in all extremities.  Neurologic: Cranial nerves II through XII grossly intact. Normal LE DTRs bilaterally. Normal gait without ataxia. Normal speech and language. No gross focal neurologic deficits are appreciated. Skin:  Skin is warm, dry and intact. No rash noted. ____________________________________________    LABS (pertinent positives/negatives) Labs Reviewed  URINALYSIS COMPLETEWITH MICROSCOPIC ([facility] ONLY) - Abnormal; Notable for the following:    Color, Urine YELLOW (*)    APPearance CLEAR (*)    All other components within normal limits  COMPREHENSIVE METABOLIC PANEL  ____________________________________________   RADIOLOGY  Lumbar Spine  IMPRESSION: Negative exam. ____________________________________________  PROCEDURES  Toradol 30 mg IM Flexeril 10 mg PO ____________________________________________  INITIAL IMPRESSION / ASSESSMENT AND PLAN / ED COURSE  Patient with lumbar sacral strain that appears to be metallic in nature. His labs and radiology are negative for any acute findings. Patient is discharged with a prescription for ketorolac and Flexeril to dose as directed. He should follow-up with a local [facility] for ongoing symptoms. Patient is also referred to gastroenterology for further evaluation of his intermittent rectal bleeding. ____________________________________________  FINAL CLINICAL IMPRESSION(S) / ED DIAGNOSES  Final diagnoses:  Lumbar strain, initial encounter  History of rectal bleeding     Lissa HoardJenise V Bacon Connie Hilgert, PA-C 07/10/15 0012  Jeanmarie PlantJames A McShane, MD 07/18/15 1414

## 2015-07-09 NOTE — ED Notes (Signed)
Lab called to notify nursing that blood sample previously sent for CMP had hemolyzed and was unusable. New order placed and sample to be collected and sent to lab at this time.

## 2015-08-11 ENCOUNTER — Encounter: Payer: Self-pay | Admitting: Emergency Medicine

## 2015-08-11 ENCOUNTER — Emergency Department
Admission: EM | Admit: 2015-08-11 | Discharge: 2015-08-11 | Disposition: A | Discharge status: Home / Self Care | Payer: Self-pay | Attending: Emergency Medicine | Admitting: Emergency Medicine

## 2015-08-11 DIAGNOSIS — F17200 Nicotine dependence, unspecified, uncomplicated: Secondary | ICD-10-CM | POA: Insufficient documentation

## 2015-08-11 DIAGNOSIS — Y939 Activity, unspecified: Secondary | ICD-10-CM | POA: Insufficient documentation

## 2015-08-11 DIAGNOSIS — Y999 Unspecified external cause status: Secondary | ICD-10-CM | POA: Insufficient documentation

## 2015-08-11 DIAGNOSIS — S0501XA Injury of conjunctiva and corneal abrasion without foreign body, right eye, initial encounter: Secondary | ICD-10-CM | POA: Insufficient documentation

## 2015-08-11 DIAGNOSIS — W500XXA Accidental hit or strike by another person, initial encounter: Secondary | ICD-10-CM | POA: Insufficient documentation

## 2015-08-11 DIAGNOSIS — Y929 Unspecified place or not applicable: Secondary | ICD-10-CM | POA: Insufficient documentation

## 2015-08-11 MED ORDER — OXYCODONE-ACETAMINOPHEN 5-325 MG PO TABS: 1.0000 | ORAL_TABLET | ORAL | 0 refills | Status: DC | PRN

## 2015-08-11 MED ORDER — OFLOXACIN 0.3 % OP SOLN: 2.0000 [drp] | Freq: Four times a day (QID) | OPHTHALMIC | 0 refills | Status: AC

## 2015-08-11 MED ORDER — FLUORESCEIN SODIUM 1 MG OP STRP
ORAL_STRIP | OPHTHALMIC | Status: AC
  Filled 2015-08-11: qty 1

## 2015-08-11 MED ORDER — EYE WASH OPHTH SOLN
OPHTHALMIC | Status: AC
  Filled 2015-08-11: qty 118

## 2015-08-11 MED ORDER — TETRACAINE HCL 0.5 % OP SOLN
OPHTHALMIC | Status: AC
  Filled 2015-08-11: qty 2

## 2015-08-11 NOTE — ED Provider Notes (Signed)
[facility] Emergency Department Provider Note   ____________________________________________   First MD Initiated Contact with Patient 08/11/15 1424     (approximate)  I have reviewed the triage vital signs and the nursing notes.   HISTORY  Chief Complaint Eye Pain   HPI Ricky Manning is a 30 y.o. male who presents today for evaluation of right eye trauma. Patient states that this AM his daughter accidentally scratched his eye. Patient states that since the time of the incident he has had increasing blurry vision, pain, photophobia, and tearing. He denies fever, headache, nausea, or vomiting. Patient is experiencing 4/10 pain and has not taken anything for the pain. Patient states that his eye is red and it is very difficult to open his eye.  History reviewed. No pertinent past medical history.  There are no active problems to display for this patient.   Past Surgical History:  Procedure Laterality Date  . TESTICLE REMOVAL      Prior to Admission medications   Medication Sig Start Date End Date Taking? Authorizing Provider  ofloxacin (OCUFLOX) 0.3 % ophthalmic solution Place 2 drops into the right eye 4 (four) times daily. 08/11/15 08/18/15  Charmayne Sheer Beers, PA-C  oxyCODONE-acetaminophen (ROXICET) 5-325 MG tablet Take 1-2 tablets by mouth every 4 (four) hours as needed for severe pain. 08/11/15   Evangeline Dakin, PA-C    Allergies Tramadol  No family history on file.  Social History Social History  Substance Use Topics  . Smoking status: Current Every Day Smoker  . Smokeless tobacco: Never Used  . Alcohol use Yes    Review of Systems Constitutional: No fever/chills Eyes: Blurry vision, red eye, increased tear production, and eye pain ENT: No sore throat. Cardiovascular: Denies chest pain. Respiratory: Denies shortness of breath. Gastrointestinal: No abdominal pain.  No nausea, no vomiting. Skin: Negative for rash. Neurological:  Negative for headaches, focal weakness or numbness. 10-point ROS otherwise negative.  ____________________________________________   PHYSICAL EXAM:  VITAL SIGNS: ED Triage Vitals  Enc Vitals Group     BP 08/11/15 1343 (!) 142/87     Pulse Rate 08/11/15 1343 79     Resp 08/11/15 1343 18     Temp 08/11/15 1343 97.5 F (36.4 C)     Temp Source 08/11/15 1343 Oral     SpO2 08/11/15 1343 100 %     Weight 08/11/15 1343 267 lb (121.1 kg)     Height 08/11/15 1343  (1.702 m)     Head Circumference --      Peak Flow --      Pain Score 08/11/15 1347 4     Pain Loc --      Pain Edu? --      Excl. in GC? --     Constitutional: Alert and oriented. Well appearing and in no acute distress. Eyes: Injected conjunctiva and notable tearing, florescent eye stain showed corneal abrasion  Head: Atraumatic. Cardiovascular: Normal rate, regular rhythm. Grossly normal heart sounds.  Good peripheral circulation. Respiratory: Normal respiratory effort.  No retractions. Lungs CTAB. Gastrointestinal: Soft and nontender. No distention Neurologic:  Normal speech and language. No gross focal neurologic deficits are appreciated. No gait instability. Skin:  Skin is warm, dry and intact. No rash noted. Psychiatric: Mood and affect are normal. Speech and behavior are normal.  ____________________________________________   LABS (all labs ordered are listed, but only abnormal results are displayed)  Labs Reviewed - No data to display ____________________________________________  ____________________________________________   PROCEDURES  Procedure(s) performed:YES Patient's right eye was anesthetic cleaning numbing with tetracaine eyedrops. Worsened strip placed demonstrating corneal abrasion eye was copiously irrigated with eyewash completion of exam.  Procedures  Critical Care performed: No  ____________________________________________   INITIAL IMPRESSION / ASSESSMENT AND PLAN / ED  COURSE  Pertinent labs & imaging results that were available during my care of the patient were reviewed by me and considered in my medical decision making (see chart for details).  Patient was given Ofloxacin for bacterial coverage. Pain medication was supplied to patient. He is instructed to look for signs of infection and to follow up with eye doctor if symptoms worsen or persist.  Clinical Course     ____________________________________________   FINAL CLINICAL IMPRESSION(S) / ED DIAGNOSES  Final diagnoses:  Corneal abrasion, right, initial encounter      NEW MEDICATIONS STARTED DURING THIS VISIT:  Discharge Medication List as of 08/11/2015  2:49 PM    START taking these medications   Details  ofloxacin (OCUFLOX) 0.3 % ophthalmic solution Place 2 drops into the right eye 4 (four) times daily., Starting Mon 08/11/2015, Until Mon 08/18/2015, Print         Note:  This document was prepared using Dragon voice recognition software and may include unintentional dictation errors.    Evangeline Dakinharles M Beers, PA-C 08/11/15 1527    Jeanmarie PlantJames A McShane, MD 08/11/15 [phone]

## 2015-08-11 NOTE — ED Triage Notes (Addendum)
States he was poked in righ eye this am.. States he was lying in bed and child hit him with their finger  Eye is red and irritated

## 2016-01-19 ENCOUNTER — Emergency Department
Admission: EM | Admit: 2016-01-19 | Discharge: 2016-01-19 | Disposition: A | Discharge status: Home / Self Care | Payer: Self-pay | Attending: Emergency Medicine | Admitting: Emergency Medicine

## 2016-01-19 ENCOUNTER — Encounter: Payer: Self-pay | Admitting: Emergency Medicine

## 2016-01-19 DIAGNOSIS — Y990 Civilian activity done for income or pay: Secondary | ICD-10-CM | POA: Insufficient documentation

## 2016-01-19 DIAGNOSIS — Y93F2 Activity, caregiving, lifting: Secondary | ICD-10-CM | POA: Insufficient documentation

## 2016-01-19 DIAGNOSIS — S39011A Strain of muscle, fascia and tendon of abdomen, initial encounter: Secondary | ICD-10-CM | POA: Insufficient documentation

## 2016-01-19 DIAGNOSIS — X500XXA Overexertion from strenuous movement or load, initial encounter: Secondary | ICD-10-CM | POA: Insufficient documentation

## 2016-01-19 DIAGNOSIS — Y929 Unspecified place or not applicable: Secondary | ICD-10-CM | POA: Insufficient documentation

## 2016-01-19 DIAGNOSIS — F17210 Nicotine dependence, cigarettes, uncomplicated: Secondary | ICD-10-CM | POA: Insufficient documentation

## 2016-01-19 LAB — COMPREHENSIVE METABOLIC PANEL
ALBUMIN: 4.2 g/dL (ref 3.5–5.0)
ALK PHOS: 63 U/L (ref 38–126)
ALT: 14 U/L — ABNORMAL LOW (ref 17–63)
ANION GAP: 7 (ref 5–15)
AST: 19 U/L (ref 15–41)
BILIRUBIN TOTAL: 0.9 mg/dL (ref 0.3–1.2)
BUN: 14 mg/dL (ref 6–20)
CALCIUM: 9.2 mg/dL (ref 8.9–10.3)
CO2: 23 mmol/L (ref 22–32)
Chloride: 108 mmol/L (ref 101–111)
Creatinine, Ser: 1.05 mg/dL (ref 0.61–1.24)
GFR calc Af Amer: >60 mL/min (ref >60)
GFR calc non Af Amer: >60 mL/min (ref >60)
GLUCOSE: 99 mg/dL (ref 65–99)
Potassium: 4.1 mmol/L (ref 3.5–5.1)
Sodium: 138 mmol/L (ref 135–145)
TOTAL PROTEIN: 7.6 g/dL (ref 6.5–8.1)

## 2016-01-19 LAB — CBC
HCT: 43.1 % (ref 40.0–52.0)
HEMOGLOBIN: 14.2 g/dL (ref 13.0–18.0)
MCH: 25.5 pg — ABNORMAL LOW (ref 26.0–34.0)
MCHC: 33 g/dL (ref 32.0–36.0)
MCV: 77.4 fL — ABNORMAL LOW (ref 80.0–100.0)
Platelets: 393 10*3/uL (ref 150–440)
RBC: 5.57 MIL/uL (ref 4.40–5.90)
RDW: 14.2 % (ref 11.5–14.5)
WBC: 12.2 10*3/uL — ABNORMAL HIGH (ref 3.8–10.6)

## 2016-01-19 LAB — URINALYSIS, COMPLETE (UACMP) WITH MICROSCOPIC
BILIRUBIN URINE: NEGATIVE
Glucose, UA: NEGATIVE mg/dL
Hgb urine dipstick: NEGATIVE
KETONES UR: NEGATIVE mg/dL
Leukocytes, UA: NEGATIVE
NITRITE: NEGATIVE
PROTEIN: NEGATIVE mg/dL
SPECIFIC GRAVITY, URINE: 1.016 (ref 1.005–1.030)
pH: 5 (ref 5.0–8.0)

## 2016-01-19 LAB — LIPASE, BLOOD: Lipase: 21 U/L (ref 11–51)

## 2016-01-19 MED ORDER — NAPROXEN 500 MG PO TABS: 500.0000 mg | ORAL_TABLET | Freq: Two times a day (BID) | ORAL | 0 refills | Status: DC

## 2016-01-19 NOTE — ED Triage Notes (Signed)
Mid abdominal pain began 3 days ago. States has had this pain intermittent x 3 to 4 years with workups but no diagnosis. States began this time at work. States lifts heavy objects at work. Denies fevers. Rare ETOH use. Denies dysuria.

## 2016-01-19 NOTE — ED Provider Notes (Signed)
[facility] Emergency Department Provider Note  ____________________________________________  Time seen: Approximately 4:25 PM  I have reviewed the triage vital signs and the nursing notes.   HISTORY  Chief Complaint Abdominal Pain    HPI Ricky Manning is a 31 y.o. male who complains of intermittent mid abdominal pain for the past 3 days. His recurring over the last 3 or 4 years, had multiple imaging studies without any diagnoses. No nausea vomiting or diarrhea. Eating and drinking normally. No aggravating or alleviating factors. It's sharp. Feels worse when sitting upright. Does regularly lift heavy objects at work. Pain is also worsened when he strains have a bowel movement.     History reviewed. No pertinent past medical history.   There are no active problems to display for this patient.    Past Surgical History:  Procedure Laterality Date  . TESTICLE REMOVAL       Prior to Admission medications   Medication Sig Start Date End Date Taking? Authorizing Provider  naproxen (NAPROSYN) 500 MG tablet Take 1 tablet (500 mg total) by mouth 2 (two) times daily with a meal. 01/19/16   Sharman CheekPhillip Candas Deemer, MD  oxyCODONE-acetaminophen (ROXICET) 5-325 MG tablet Take 1-2 tablets by mouth every 4 (four) hours as needed for severe pain. 08/11/15   Evangeline Dakinharles M Beers, PA-C     Allergies Tramadol   No family history on file.  Social History Social History  Substance Use Topics  . Smoking status: Current Every Day Smoker    Packs/day: 1.00    Types: Cigarettes  . Smokeless tobacco: Never Used  . Alcohol use Yes    Review of Systems  Constitutional:   No fever or chills.  ENT:   No sore throat. No rhinorrhea. Cardiovascular:   No chest pain. Respiratory:   No dyspnea or cough. Gastrointestinal:   Positive as above for abdominal pain without vomiting and diarrhea.  Genitourinary:   Negative for dysuria or difficulty urinating. Musculoskeletal:    Negative for focal pain or swelling Neurological:   Negative for headaches 10-point ROS otherwise negative.  ____________________________________________   PHYSICAL EXAM:  VITAL SIGNS: ED Triage Vitals [01/19/16 1206]  Enc Vitals Group     BP (!) 149/100     Pulse Rate 72     Resp 18     Temp 97.5 F (36.4 C)     Temp Source Oral     SpO2 98 %     Weight 245 lb (111.1 kg)     Height 5\' 7"  (1.702 m)     Head Circumference      Peak Flow      Pain Score 10     Pain Loc      Pain Edu?      Excl. in GC?     Vital signs reviewed, nursing assessments reviewed.   Constitutional:   Alert and oriented. Well appearing and in no distress. Eyes:   No scleral icterus. ENT   Head:   Normocephalic and atraumatic.   Mouth/Throat:   MMM, no pharyngeal erythema. No peritonsillar mass.    Neck:   No stridor. No SubQ emphysema. No meningismus. Hematological/Lymphatic/Immunilogical:   No cervical lymphadenopathy. Cardiovascular:   RRR. Symmetric bilateral radial and DP pulses.  No murmurs.  Respiratory:   Normal respiratory effort without tachypnea nor retractions. Breath sounds are clear and equal bilaterally. No wheezes/rales/rhonchi. Gastrointestinal:   Soft with mild tenderness in the midline with superficial palpation of the abdominal wall, particularly just  superior to the umbilicus. In this area there is a subcentimeter fascial defect palpable without any bulge or mass.. Non distended. There is no CVA tenderness.  No rebound, rigidity, or guarding. Genitourinary:   deferred Musculoskeletal:   Nontender with normal range of motion in all extremities. No joint effusions.  No lower extremity tenderness.  No edema. Neurologic:   Normal speech and language.  CN 2-10 normal. Motor grossly intact. No gross focal neurologic deficits are appreciated.  Skin:    Skin is warm, dry and intact. No rash noted.  No petechiae, purpura, or  bullae.  ____________________________________________    LABS (pertinent positives/negatives) (all labs ordered are listed, but only abnormal results are displayed) Labs Reviewed  COMPREHENSIVE METABOLIC PANEL - Abnormal; Notable for the following:       Result Value   ALT 14 (*)    All other components within normal limits  CBC - Abnormal; Notable for the following:    WBC 12.2 (*)    MCV 77.4 (*)    MCH 25.5 (*)    All other components within normal limits  URINALYSIS, COMPLETE (UACMP) WITH MICROSCOPIC - Abnormal; Notable for the following:    Color, Urine YELLOW (*)    APPearance CLEAR (*)    All other components within normal limits  LIPASE, BLOOD   ____________________________________________   EKG    ____________________________________________    RADIOLOGY    ____________________________________________   PROCEDURES Procedures  ____________________________________________   INITIAL IMPRESSION / ASSESSMENT AND PLAN / ED COURSE  Pertinent labs & imaging results that were available during my care of the patient were reviewed by me and considered in my medical decision making (see chart for details).  Patient presents with abdominal pain, clinically appears to be a very small fascial defect the beginning of a ventral hernia. Counseled patient on this and avoiding heavy lifting and using a stool softener to avoid straining when he goes to the bathroom. Work note provided. NSAIDs and heat therapy for now. Follow up with primary care. Return precautions given.     Clinical Course    ____________________________________________   FINAL CLINICAL IMPRESSION(S) / ED DIAGNOSES  Final diagnoses:  Abdominal wall strain, initial encounter      New Prescriptions   NAPROXEN (NAPROSYN) 500 MG TABLET    Take 1 tablet (500 mg total) by mouth 2 (two) times daily with a meal.     Portions of this note were generated with dragon dictation software. Dictation  errors may occur despite best attempts at proofreading.    Sharman Cheek, MD 01/19/16 [phone]

## 2016-02-08 ENCOUNTER — Encounter: Payer: Self-pay | Admitting: Emergency Medicine

## 2016-02-08 ENCOUNTER — Emergency Department
Admission: EM | Admit: 2016-02-08 | Discharge: 2016-02-08 | Disposition: A | Discharge status: Home / Self Care | Payer: Self-pay | Attending: Emergency Medicine | Admitting: Emergency Medicine

## 2016-02-08 DIAGNOSIS — J111 Influenza due to unidentified influenza virus with other respiratory manifestations: Secondary | ICD-10-CM

## 2016-02-08 DIAGNOSIS — R112 Nausea with vomiting, unspecified: Secondary | ICD-10-CM | POA: Insufficient documentation

## 2016-02-08 DIAGNOSIS — R197 Diarrhea, unspecified: Secondary | ICD-10-CM | POA: Insufficient documentation

## 2016-02-08 DIAGNOSIS — F17210 Nicotine dependence, cigarettes, uncomplicated: Secondary | ICD-10-CM | POA: Insufficient documentation

## 2016-02-08 DIAGNOSIS — R69 Illness, unspecified: Secondary | ICD-10-CM

## 2016-02-08 MED ORDER — ACETAMINOPHEN-CODEINE #3 300-30 MG PO TABS: 1.0000 | ORAL_TABLET | Freq: Three times a day (TID) | ORAL | 0 refills | Status: DC | PRN

## 2016-02-08 MED ORDER — FLUTICASONE PROPIONATE 50 MCG/ACT NA SUSP: 2.0000 | Freq: Every day | NASAL | 0 refills | Status: DC

## 2016-02-08 MED ORDER — BENZONATATE 100 MG PO CAPS: 100.0000 mg | ORAL_CAPSULE | Freq: Three times a day (TID) | ORAL | 0 refills | Status: DC | PRN

## 2016-02-08 NOTE — ED Provider Notes (Signed)
[facility] Emergency Department Provider Note ____________________________________________  Time seen: 1124  I have reviewed the triage vital signs and the nursing notes.  HISTORY  Chief Complaint  Influenza  HPI Ricky Manning is a 31 y.o. male presents to the ED for evaluation of flulike symptoms. Patient describes onset of symptoms about 4 days prior. Over the last 2 days however he's had increased cough production, body aches, nausea, vomiting, and diarrhea times one. He denies any frank fevers, chills, sweats. He has been taking DayQuil and NyQuil for symptom relief. The patient reports he did not receive the seasonal flu vaccine.  History reviewed. No pertinent past medical history.  There are no active problems to display for this patient.   Past Surgical History:  Procedure Laterality Date  . TESTICLE REMOVAL      Prior to Admission medications   Medication Sig Start Date End Date Taking? Authorizing Provider  acetaminophen-codeine (TYLENOL #3) 300-30 MG tablet Take 1 tablet by mouth every 8 (eight) hours as needed for moderate pain. 02/08/16   Ronalee Scheunemann V Bacon Tykera Skates, PA-C  benzonatate (TESSALON PERLES) 100 MG capsule Take 1 capsule (100 mg total) by mouth 3 (three) times daily as needed for cough (Take 1-2 per dose). 02/08/16   Joory Gough V Bacon Montay Vanvoorhis, PA-C  fluticasone (FLONASE) 50 MCG/ACT nasal spray Place 2 sprays into both nostrils daily. 02/08/16   Xiong Haidar V Bacon Jamese Trauger, PA-C  naproxen (NAPROSYN) 500 MG tablet Take 1 tablet (500 mg total) by mouth 2 (two) times daily with a meal. 01/19/16   Sharman Cheek, MD  oxyCODONE-acetaminophen (ROXICET) 5-325 MG tablet Take 1-2 tablets by mouth every 4 (four) hours as needed for severe pain. 08/11/15   Evangeline Dakin, PA-C    Allergies Tramadol  History reviewed. No pertinent family history.  Social History Social History  Substance Use Topics  . Smoking status: Current Every Day Smoker    Packs/day:  1.00    Types: Cigarettes  . Smokeless tobacco: Never Used  . Alcohol use Yes    Review of Systems  Constitutional: Negative for fever. Eyes: Negative for visual changes. ENT: Negative for sore throat. Reports sinus drainage and congestion as above. Cardiovascular: Negative for chest pain. Respiratory: Negative for shortness of breath. Gastrointestinal: Positive for nausea, vomiting and diarrhea. Genitourinary: Negative for dysuria. Musculoskeletal: Negative for back pain. Was generalized body aches. Skin: Negative for rash. Neurological: Negative for headaches, focal weakness or numbness. ____________________________________________  PHYSICAL EXAM:  VITAL SIGNS: ED Triage Vitals [02/08/16 1037]  Enc Vitals Group     BP (!) 152/105     Pulse Rate 78     Resp      Temp 97.6 F (36.4 C)     Temp Source Oral     SpO2 96 %     Weight 250 lb (113.4 kg)     Height 5\' 6"  (1.676 m)     Head Circumference      Peak Flow      Pain Score      Pain Loc      Pain Edu?      Excl. in GC?     Constitutional: Alert and oriented. Well appearing and in no distress. Head: Normocephalic and atraumatic. Eyes: Conjunctivae are normal. PERRL. Normal extraocular movements Ears: Canals clear. TMs intact bilaterally. Nose: No congestion. Copious , cloudy rhinorrhea. No epistaxis. Mouth/Throat: Mucous membranes are moist. Uvula is midline and tonsils are flat. No oropharyngeal lesions or erythema is noted.  Neck: Supple. No thyromegaly. Hematological/Lymphatic/Immunological: No cervical lymphadenopathy. Cardiovascular: Normal rate, regular rhythm. Normal distal pulses. Respiratory: Normal respiratory effort. No wheezes/rales/rhonchi. Gastrointestinal: Soft and nontender. No distention. Musculoskeletal: Nontender with normal range of motion in all extremities.  ____________________________________________  INITIAL IMPRESSION / ASSESSMENT AND PLAN / ED COURSE  Visual symptoms are  consistent with a likely viral URI that may represent influenza. He is discharged at this time with prescriptions for Tylenol No. 3, Tessalon Perles, and Flonase. He is advised to dose over-the-counter cough and cold medicines as previous. He may add Delsym for cough relief as well. He should hydrate, rest, and follow-up with one of the local committee clinics. ____________________________________________  FINAL CLINICAL IMPRESSION(S) / ED DIAGNOSES  Final diagnoses:  Influenza-like illness     Lissa HoardJenise V Bacon Ahriana Gunkel, PA-C 02/08/16 1142    Myrna Blazeravid Matthew Schaevitz, MD 02/08/16 [phone]

## 2016-02-08 NOTE — ED Triage Notes (Signed)
Pt with body aches, cough, sore throat x 3 days. Diarrhea x 1 today. Fever unknown

## 2016-02-08 NOTE — ED Notes (Signed)

## 2016-02-08 NOTE — Discharge Instructions (Signed)
Even without testing, your symptoms appear consistent with influenza. You should continue to rest, hydrate, and take OTC medications. Take the prescription cough medicine, nasal spray, and pain medicines as needed. [facility]

## 2016-02-16 ENCOUNTER — Emergency Department
Admission: EM | Admit: 2016-02-16 | Discharge: 2016-02-16 | Disposition: A | Discharge status: Home / Self Care | Payer: Self-pay | Attending: Emergency Medicine | Admitting: Emergency Medicine

## 2016-02-16 ENCOUNTER — Encounter: Payer: Self-pay | Admitting: Emergency Medicine

## 2016-02-16 DIAGNOSIS — M5432 Sciatica, left side: Secondary | ICD-10-CM

## 2016-02-16 DIAGNOSIS — M5442 Lumbago with sciatica, left side: Secondary | ICD-10-CM | POA: Insufficient documentation

## 2016-02-16 DIAGNOSIS — F17210 Nicotine dependence, cigarettes, uncomplicated: Secondary | ICD-10-CM | POA: Insufficient documentation

## 2016-02-16 MED ORDER — CYCLOBENZAPRINE HCL 10 MG PO TABS: 10.0000 mg | ORAL_TABLET | Freq: Three times a day (TID) | ORAL | 0 refills | Status: DC | PRN

## 2016-02-16 MED ORDER — MELOXICAM 15 MG PO TABS: 15.0000 mg | ORAL_TABLET | Freq: Every day | ORAL | 0 refills | Status: DC

## 2016-02-16 NOTE — ED Provider Notes (Signed)
[facility] Emergency Department Provider Note ____________________________________________  Time seen: Approximately 11:26 AM  I have reviewed the triage vital signs and the nursing notes.   HISTORY  Chief Complaint Back Pain    HPI Ricky Manning is a 31 y.o. male who presents to the emergency department for evaluation of back pain. He denies injury. He has not had similar symptoms in the pain in the past.No relief with ibuprofen.   History reviewed. No pertinent past medical history.  There are no active problems to display for this patient.   Past Surgical History:  Procedure Laterality Date  . TESTICLE REMOVAL      Prior to Admission medications   Medication Sig Start Date End Date Taking? Authorizing Provider  acetaminophen-codeine (TYLENOL #3) 300-30 MG tablet Take 1 tablet by mouth every 8 (eight) hours as needed for moderate pain. 02/08/16   Jenise V Bacon Menshew, PA-C  benzonatate (TESSALON PERLES) 100 MG capsule Take 1 capsule (100 mg total) by mouth 3 (three) times daily as needed for cough (Take 1-2 per dose). 02/08/16   Jenise V Bacon Menshew, PA-C  cyclobenzaprine (FLEXERIL) 10 MG tablet Take 1 tablet (10 mg total) by mouth 3 (three) times daily as needed for muscle spasms. 02/16/16   Chinita Pesterari B Dachelle Molzahn, FNP  fluticasone (FLONASE) 50 MCG/ACT nasal spray Place 2 sprays into both nostrils daily. 02/08/16   Jenise V Bacon Menshew, PA-C  meloxicam (MOBIC) 15 MG tablet Take 1 tablet (15 mg total) by mouth daily. 02/16/16   Chinita Pesterari B Marlowe Lawes, FNP    Allergies Tramadol  No family history on file.  Social History Social History  Substance Use Topics  . Smoking status: Current Every Day Smoker    Packs/day: 1.00    Types: Cigarettes  . Smokeless tobacco: Never Used  . Alcohol use Yes    Review of Systems Constitutional: No recent illness. Cardiovascular: Denies chest pain or palpitations. Respiratory: Denies shortness of  breath. Musculoskeletal: Pain in lower back. Skin: Negative for rash, wound, lesion. Neurological: Negative for focal weakness or numbness.  ____________________________________________   PHYSICAL EXAM:  VITAL SIGNS: ED Triage Vitals  Enc Vitals Group     BP 02/16/16 0851 (!) 139/91     Pulse Rate 02/16/16 0851 76     Resp 02/16/16 0851 18     Temp 02/16/16 0851 97.5 F (36.4 C)     Temp Source 02/16/16 0851 Oral     SpO2 02/16/16 0851 98 %     Weight 02/16/16 0852 250 lb (113.4 kg)     Height 02/16/16 0852 5\' 7"  (1.702 m)     Head Circumference --      Peak Flow --      Pain Score 02/16/16 0850 9     Pain Loc --      Pain Edu? --      Excl. in GC? --     Constitutional: Alert and oriented. Well appearing and in no acute distress. Eyes: Conjunctivae are normal. EOMI. Head: Atraumatic. Neck: No stridor.  Respiratory: Normal respiratory effort.   Musculoskeletal: No focal midline tenderness. Patient able to ambulate without assistance. Negative straight leg raise.  Neurologic:  Normal speech and language. No gross focal neurologic deficits are appreciated. Speech is normal. No gait instability. Skin:  Skin is warm, dry and intact. Atraumatic. Psychiatric: Mood and affect are normal. Speech and behavior are normal.  ____________________________________________   LABS (all labs ordered are listed, but only abnormal results are  displayed)  Labs Reviewed - No data to display ____________________________________________  RADIOLOGY  Not indicated. ____________________________________________   PROCEDURES  Procedure(s) performed: None   ____________________________________________   INITIAL IMPRESSION / ASSESSMENT AND PLAN / ED COURSE     Pertinent labs & imaging results that were available during my care of the patient were reviewed by me and considered in my medical decision making (see chart for details).  31 year old male presenting to the emergency  department for evaluation of lower back pain. No specific injury. He will be discharged with flexeril and meloxicam. He was advised to follow up with the primary care provider of his choice or return to the ER for symptoms that change or worsen if unable to schedule an appointment. ____________________________________________   FINAL CLINICAL IMPRESSION(S) / ED DIAGNOSES  Final diagnoses:  Sciatica of left side       Chinita Pester, FNP 02/16/16 1346    Rockne Menghini, MD 02/16/16 1524

## 2016-02-16 NOTE — ED Triage Notes (Signed)
Pt with left leg pain that radiates into back, denies injury.

## 2016-02-16 NOTE — ED Notes (Signed)
See triage note having lower back and left leg pain w/o injury

## 2016-02-18 ENCOUNTER — Encounter: Payer: Self-pay | Admitting: Emergency Medicine

## 2016-02-18 ENCOUNTER — Emergency Department
Admission: EM | Admit: 2016-02-18 | Discharge: 2016-02-18 | Disposition: A | Discharge status: Home / Self Care | Payer: Self-pay | Attending: Emergency Medicine | Admitting: Emergency Medicine

## 2016-02-18 DIAGNOSIS — F17210 Nicotine dependence, cigarettes, uncomplicated: Secondary | ICD-10-CM | POA: Insufficient documentation

## 2016-02-18 DIAGNOSIS — M5442 Lumbago with sciatica, left side: Secondary | ICD-10-CM | POA: Insufficient documentation

## 2016-02-18 MED ORDER — PREDNISONE 10 MG PO TABS: ORAL_TABLET | ORAL | 0 refills | Status: DC

## 2016-02-18 NOTE — ED Triage Notes (Signed)
Pt from home with lower back pain running down left leg. Pt states he was seen here a week ago and that the pain is worse, even though he has followed all discharge instructions. Pt alert & oriented with NAD noted.

## 2016-02-18 NOTE — Discharge Instructions (Signed)
Follow-up with Dr. Joice LoftsPoggi if any continued problems with your back. Begin taking prednisone 6 tablets today and taper as directed. Continue taking Flexeril and you may also use ice or heat to your back for comfort.

## 2016-02-18 NOTE — ED Provider Notes (Signed)
[facility] Emergency Department Provider Note  ____________________________________________   First MD Initiated Contact with Patient 02/18/16 1323     (approximate)  I have reviewed the triage vital signs and the nursing notes.   HISTORY  Chief Complaint Back Pain    HPI Ricky Manning is a 31 y.o. male here complaining of low back pain with radiation to his left leg. Patient was seen in the emergency department 2 days ago where he is placed on Flexeril and meloxicam. Patient states that this is not helping and he continues to have a numbness sensation in his left leg. He denies any incontinence of bowel or bladder. He denies any previous problems with his back. He states that he has never seen an orthopedist for his back. He denies any history of UTIs or kidney stones. Currently rates his pain as a 10 over 10.   History reviewed. No pertinent past medical history.  There are no active problems to display for this patient.   Past Surgical History:  Procedure Laterality Date  . TESTICLE REMOVAL      Prior to Admission medications   Medication Sig Start Date End Date Taking? Authorizing Provider  cyclobenzaprine (FLEXERIL) 10 MG tablet Take 1 tablet (10 mg total) by mouth 3 (three) times daily as needed for muscle spasms. 02/16/16   Chinita Pester, FNP  fluticasone (FLONASE) 50 MCG/ACT nasal spray Place 2 sprays into both nostrils daily. 02/08/16   Jenise V Bacon Menshew, PA-C  meloxicam (MOBIC) 15 MG tablet Take 1 tablet (15 mg total) by mouth daily. 02/16/16   Chinita Pester, FNP  predniSONE (DELTASONE) 10 MG tablet Take 6 tablets  today, on day 2 take 5 tablets, day 3 take 4 tablets, day 4 take 3 tablets, day 5 take  2 tablets and 1 tablet the last day 02/18/16   Tommi Rumps, PA-C    Allergies Tramadol  History reviewed. No pertinent family history.  Social History Social History  Substance Use Topics  . Smoking status: Current Every Day  Smoker    Packs/day: 1.00    Types: Cigarettes  . Smokeless tobacco: Never Used  . Alcohol use Yes     Comment: rare    Review of Systems Constitutional: No fever/chills Eyes: No visual changes. ENT: No sore throat. Cardiovascular: Denies chest pain. Respiratory: Denies shortness of breath. Gastrointestinal: No abdominal pain.  No nausea, no vomiting.  No diarrhea.  No constipation. Musculoskeletal: Positive for low back pain. Skin: Negative for rash. Neurological: Negative for headaches, focal weakness area and positive for left leg radiculopathy.  10-point ROS otherwise negative.  ____________________________________________   PHYSICAL EXAM:  VITAL SIGNS: ED Triage Vitals  Enc Vitals Group     BP 02/18/16 1238 (!) 163/94     Pulse Rate 02/18/16 1238 83     Resp 02/18/16 1238 18     Temp 02/18/16 1238 97.5 F (36.4 C)     Temp Source 02/18/16 1238 Oral     SpO2 02/18/16 1238 98 %     Weight 02/18/16 1238 250 lb (113.4 kg)     Height 02/18/16 1238 5\' 7"  (1.702 m)     Head Circumference --      Peak Flow --      Pain Score 02/18/16 1239 10     Pain Loc --      Pain Edu? --      Excl. in GC? --     Constitutional: Alert  and oriented. Well appearing and in no acute distress. Eyes: Conjunctivae are normal. PERRL. EOMI. Head: Atraumatic. Nose: No congestion/rhinnorhea. Neck: No stridor.   Cardiovascular: Normal rate, regular rhythm. Grossly normal heart sounds.  Good peripheral circulation. Respiratory: Normal respiratory effort.  No retractions. Lungs CTAB. Gastrointestinal: Soft and nontender. No distention.  Musculoskeletal: Examination of the back there is no point tenderness on palpation of the vertebral bodies. There is tenderness on the sacral area and paravertebral muscles surrounding that. There is no active muscle spasm seen. Straight leg raise is unremarkable. Neurologic:  Normal speech and language. No gross focal neurologic deficits are appreciated.  Reflexes are 2+ bilaterally. No gait instability. Skin:  Skin is warm, dry and intact. No rash noted. Psychiatric: Mood and affect are normal. Speech and behavior are normal.  ____________________________________________   LABS (all labs ordered are listed, but only abnormal results are displayed)  Labs Reviewed - No data to display RADIOLOGY  Lumbar spine x-ray from July 2017 was reviewed. No abnormality was seen per radiologist. ____________________________________________   PROCEDURES  Procedure(s) performed: None  Procedures  Critical Care performed: No  ____________________________________________   INITIAL IMPRESSION / ASSESSMENT AND PLAN / ED COURSE  Pertinent labs & imaging results that were available during my care of the patient were reviewed by me and considered in my medical decision making (see chart for details).   Patient was seen in ER 2 days ago with same symptoms.  Patient states the Flexeril and meloxicam is not helped with his back pain. He continues to have same symptoms. He denies any urinary or bowel incontinence.  Patient is continue taking Flexeril as needed for muscle spasms. He was started on a prednisone Dosepak 60 mg 6 day taper. He is to follow-up with Dr. Joice LoftsPoggi if any continued problems with his back.     ____________________________________________   FINAL CLINICAL IMPRESSION(S) / ED DIAGNOSES  Final diagnoses:  Acute back pain with sciatica, left      NEW MEDICATIONS STARTED DURING THIS VISIT:  Discharge Medication List as of 02/18/2016  1:38 PM    START taking these medications   Details  predniSONE (DELTASONE) 10 MG tablet Take 6 tablets  today, on day 2 take 5 tablets, day 3 take 4 tablets, day 4 take 3 tablets, day 5 take  2 tablets and 1 tablet the last day, Print         Note:  This document was prepared using Dragon voice recognition software and may include unintentional dictation errors.    Tommi Rumpshonda L Mekiah Cambridge,  PA-C 02/18/16 1459    Jeanmarie PlantJames A McShane, MD 02/18/16 [phone]

## 2016-02-18 NOTE — ED Notes (Signed)
"  the reason I came in for a nerve in my lower back." states ice, heat, taking prescriptions. Was supposed to go back to work today but states "I can't bend over or put my clothes on." States was seen Monday for same but states it's not getting better. "I have to lay down 80% of the day." States low back pain started last Thursday. Alert and oriented.

## 2016-02-24 ENCOUNTER — Encounter: Payer: Self-pay | Admitting: Medical Oncology

## 2016-02-24 ENCOUNTER — Emergency Department
Admission: EM | Admit: 2016-02-24 | Discharge: 2016-02-24 | Disposition: A | Discharge status: Home / Self Care | Payer: Self-pay | Attending: Emergency Medicine | Admitting: Emergency Medicine

## 2016-02-24 ENCOUNTER — Emergency Department: Payer: Self-pay

## 2016-02-24 DIAGNOSIS — F17210 Nicotine dependence, cigarettes, uncomplicated: Secondary | ICD-10-CM | POA: Insufficient documentation

## 2016-02-24 DIAGNOSIS — M5136 Other intervertebral disc degeneration, lumbar region: Secondary | ICD-10-CM

## 2016-02-24 DIAGNOSIS — M5137 Other intervertebral disc degeneration, lumbosacral region: Secondary | ICD-10-CM

## 2016-02-24 DIAGNOSIS — M5417 Radiculopathy, lumbosacral region: Secondary | ICD-10-CM

## 2016-02-24 DIAGNOSIS — M51379 Other intervertebral disc degeneration, lumbosacral region without mention of lumbar back pain or lower extremity pain: Secondary | ICD-10-CM

## 2016-02-24 DIAGNOSIS — M5416 Radiculopathy, lumbar region: Secondary | ICD-10-CM

## 2016-02-24 DIAGNOSIS — M5126 Other intervertebral disc displacement, lumbar region: Secondary | ICD-10-CM

## 2016-02-24 DIAGNOSIS — M5117 Intervertebral disc disorders with radiculopathy, lumbosacral region: Secondary | ICD-10-CM | POA: Insufficient documentation

## 2016-02-24 DIAGNOSIS — M51369 Other intervertebral disc degeneration, lumbar region without mention of lumbar back pain or lower extremity pain: Secondary | ICD-10-CM

## 2016-02-24 MED ORDER — MELOXICAM 15 MG PO TABS: 15.0000 mg | ORAL_TABLET | Freq: Every day | ORAL | 0 refills | Status: DC

## 2016-02-24 MED ORDER — OXYCODONE-ACETAMINOPHEN 5-325 MG PO TABS: 1.0000 | ORAL_TABLET | ORAL | 0 refills | Status: DC | PRN

## 2016-02-24 NOTE — Discharge Instructions (Signed)
You will need to see the neurosurgeon. Call tomorrow for the appointment. Return to the emergency department for any worsening of your symptoms.

## 2016-02-24 NOTE — ED Provider Notes (Signed)
[facility] Emergency Department Provider Note ____________________________________________  Time seen: Approximately 6:18 PM  I have reviewed the triage vital signs and the nursing notes.   HISTORY  Chief Complaint Back Pain    HPI Neythan T Brissett is a 31 y.o. male who presents to the emergency department for her third visit due to lower back pain. He has been taking prednisone, Flexeril, and meloxicam as prescribed without relief. He states that the pain is across the lumbar with radiation down the posterior aspect of his left lower extremity and has "tingling" into his groin as he attempts to move or stand.  History reviewed. No pertinent past medical history.  There are no active problems to display for this patient.   Past Surgical History:  Procedure Laterality Date  . TESTICLE REMOVAL      Prior to Admission medications   Medication Sig Start Date End Date Taking? Authorizing Provider  cyclobenzaprine (FLEXERIL) 10 MG tablet Take 1 tablet (10 mg total) by mouth 3 (three) times daily as needed for muscle spasms. 02/16/16   Chinita Pesterari B Jalyne Brodzinski, FNP  fluticasone (FLONASE) 50 MCG/ACT nasal spray Place 2 sprays into both nostrils daily. 02/08/16   Jenise V Bacon Menshew, PA-C  meloxicam (MOBIC) 15 MG tablet Take 1 tablet (15 mg total) by mouth daily. 02/24/16   Chinita Pesterari B Xion Debruyne, FNP  oxyCODONE-acetaminophen (ROXICET) 5-325 MG tablet Take 1 tablet by mouth every 4 (four) hours as needed for severe pain. 02/24/16   Chinita Pesterari B Janie Strothman, FNP  predniSONE (DELTASONE) 10 MG tablet Take 6 tablets  today, on day 2 take 5 tablets, day 3 take 4 tablets, day 4 take 3 tablets, day 5 take  2 tablets and 1 tablet the last day 02/18/16   Tommi Rumpshonda L Summers, PA-C    Allergies Tramadol  No family history on file.  Social History Social History  Substance Use Topics  . Smoking status: Current Every Day Smoker    Packs/day: 1.00    Types: Cigarettes  . Smokeless tobacco: Never Used   . Alcohol use Yes     Comment: rare    Review of Systems Constitutional: No recent illness. Cardiovascular: Denies chest pain or palpitations. Respiratory: Denies shortness of breath. Musculoskeletal: Pain in lumbar area with radiculopathy down LLE and into left groin. GI: Positive for constipation. Skin: Negative for rash, wound, lesion. Neurological: Negative for focal weakness or numbness. Negative for loss of bowel or bladder control.   ____________________________________________   PHYSICAL EXAM:  VITAL SIGNS: ED Triage Vitals  Enc Vitals Group     BP 02/24/16 1744 (!) 150/98     Pulse Rate 02/24/16 1744 77     Resp 02/24/16 1744 16     Temp 02/24/16 1744 97.8 F (36.6 C)     Temp Source 02/24/16 1744 Oral     SpO2 02/24/16 1744 98 %     Weight 02/24/16 1743 250 lb (113.4 kg)     Height 02/24/16 1743 5\' 7"  (1.702 m)     Head Circumference --      Peak Flow --      Pain Score 02/24/16 1743 10     Pain Loc --      Pain Edu? --      Excl. in GC? --     Constitutional: Alert and oriented. Well appearing and in no acute distress. Eyes: Conjunctivae are normal. EOMI. Head: Atraumatic. Neck: No stridor.  Respiratory: Normal respiratory effort.   Musculoskeletal: Positive straight leg  raise on the left side approximately 45. No focal midline tenderness of the lumbar spine appreciated on exam. Neurologic:  Normal speech and language. No gross focal neurologic deficits are appreciated. Speech is normal. No gait instability. Skin:  Skin is warm, dry and intact. Atraumatic. Psychiatric: Mood and affect are normal. Speech and behavior are normal.  ____________________________________________   LABS (all labs ordered are listed, but only abnormal results are displayed)  Labs Reviewed - No data to display ____________________________________________  RADIOLOGY  MR Lumbar Spine:  IMPRESSION:  Moderately large disc protrusion on the left at L4-5 with extruded   disc fragment. Compression of the left L5 nerve root in the  subarticular zone. In addition, there is moderate foraminal  encroachment bilaterally at L4-5    Disc bulging and spurring L5-S1 with moderate foraminal encroachment  bilaterally.   ____________________________________________   PROCEDURES  Procedure(s) performed: none   ____________________________________________   INITIAL IMPRESSION / ASSESSMENT AND PLAN / ED COURSE     Pertinent labs & imaging results that were available during my care of the patient were reviewed by me and considered in my medical decision making (see chart for details).  31 year old male presenting to the emergency department for evaluation of a lumbar pain that extends into the left lower extremity. No motor deficits were demonstrated today. Patient able to perform unassisted left leg raise to about 45 and then reports triggering of radiculopathy. He was able to demonstrate an unassisted, steady gait. He was given strict return precautions. He was also advised to follow-up with neurosurgery and is to call tomorrow to schedule an appointment.  ____________________________________________   FINAL CLINICAL IMPRESSION(S) / ED DIAGNOSES  Final diagnoses:  Radicular pain of lumbosacral region  L4-L5 disc bulge  Degenerative disc disease at L5-S1 level  Compression of lumbar nerve root       Chinita Pester, FNP 02/24/16 2017    Merrily Brittle, MD 02/25/16 1517

## 2016-02-24 NOTE — ED Triage Notes (Signed)
Pt diagnosed with sciatica 2 weeks ago, continues to have lower back pain.

## 2016-02-24 NOTE — ED Notes (Signed)
Taken to MRI via w/c with Lyla Sonarrie EDT

## 2016-02-24 NOTE — ED Notes (Signed)
MRI called, ETA 15 min

## 2017-01-04 HISTORY — PX: SMALL INTESTINE SURGERY: SHX150

## 2017-01-14 ENCOUNTER — Emergency Department
Admission: EM | Admit: 2017-01-14 | Discharge: 2017-01-14 | Disposition: A | Discharge status: Home / Self Care | Payer: Self-pay | Attending: Emergency Medicine | Admitting: Emergency Medicine

## 2017-01-14 ENCOUNTER — Encounter: Payer: Self-pay | Admitting: Emergency Medicine

## 2017-01-14 ENCOUNTER — Other Ambulatory Visit: Payer: Self-pay

## 2017-01-14 DIAGNOSIS — R109 Unspecified abdominal pain: Secondary | ICD-10-CM | POA: Insufficient documentation

## 2017-01-14 DIAGNOSIS — Z5321 Procedure and treatment not carried out due to patient leaving prior to being seen by health care provider: Secondary | ICD-10-CM | POA: Insufficient documentation

## 2017-01-14 LAB — COMPREHENSIVE METABOLIC PANEL
ALBUMIN: 4.4 g/dL (ref 3.5–5.0)
ALK PHOS: 81 U/L (ref 38–126)
ALT: 27 U/L (ref 17–63)
AST: 25 U/L (ref 15–41)
Anion gap: 9 (ref 5–15)
BILIRUBIN TOTAL: 1.2 mg/dL (ref 0.3–1.2)
BUN: 13 mg/dL (ref 6–20)
CALCIUM: 9 mg/dL (ref 8.9–10.3)
CO2: 23 mmol/L (ref 22–32)
CREATININE: 0.96 mg/dL (ref 0.61–1.24)
Chloride: 103 mmol/L (ref 101–111)
GFR calc Af Amer: >60 mL/min (ref >60)
GFR calc non Af Amer: >60 mL/min (ref >60)
GLUCOSE: 93 mg/dL (ref 65–99)
Potassium: 3.7 mmol/L (ref 3.5–5.1)
Sodium: 135 mmol/L (ref 135–145)
TOTAL PROTEIN: 7.7 g/dL (ref 6.5–8.1)

## 2017-01-14 LAB — CBC
HCT: 44.8 % (ref 40.0–52.0)
Hemoglobin: 14.8 g/dL (ref 13.0–18.0)
MCH: 25.7 pg — ABNORMAL LOW (ref 26.0–34.0)
MCHC: 32.9 g/dL (ref 32.0–36.0)
MCV: 77.9 fL — ABNORMAL LOW (ref 80.0–100.0)
Platelets: 401 10*3/uL (ref 150–440)
RBC: 5.75 MIL/uL (ref 4.40–5.90)
RDW: 13.2 % (ref 11.5–14.5)
WBC: 14.8 10*3/uL — ABNORMAL HIGH (ref 3.8–10.6)

## 2017-01-14 LAB — LIPASE, BLOOD: Lipase: 23 U/L (ref 11–51)

## 2017-01-14 NOTE — ED Triage Notes (Signed)
Pt to ED via POV c/o bilateral lower abdominal pain. Pt states that it hurts when he tries to urinate. Pt denies N/V/D, fevers, or chills. Pt states that any movement makes the abdominal worse. Pt appears to be uncomfortable in triage. Pt in NAD at this time.

## 2017-01-17 MED ORDER — SENNOSIDES 8.6 MG PO TABS
2.00 | ORAL_TABLET | ORAL | Status: DC
Start: 2017-01-17 — End: 2017-01-17

## 2017-01-17 MED ORDER — FAMOTIDINE 20 MG PO TABS
20.00 mg | ORAL_TABLET | ORAL | Status: DC
Start: 2017-01-17 — End: 2017-01-17

## 2017-01-17 MED ORDER — POLYETHYLENE GLYCOL 3350 17 G PO PACK
17.00 | PACK | ORAL | Status: DC
Start: 2017-01-17 — End: 2017-01-17

## 2017-01-17 MED ORDER — TRAZODONE HCL 50 MG PO TABS
50.00 mg | ORAL_TABLET | ORAL | Status: DC
Start: ? — End: 2017-01-17

## 2017-01-17 MED ORDER — GENERIC EXTERNAL MEDICATION
Status: DC
Start: ? — End: 2017-01-17

## 2017-01-17 MED ORDER — ACETAMINOPHEN 325 MG PO TABS
650.00 mg | ORAL_TABLET | ORAL | Status: DC
Start: ? — End: 2017-01-17

## 2017-01-17 MED ORDER — LACTATED RINGERS IV SOLN
75.00 | INTRAVENOUS | Status: DC
Start: ? — End: 2017-01-17

## 2017-01-17 MED ORDER — GENERIC EXTERNAL MEDICATION
3.00 | Status: DC
Start: 2017-01-17 — End: 2017-01-17

## 2017-01-17 MED ORDER — GENERIC EXTERNAL MEDICATION
30.00 | Status: DC
Start: ? — End: 2017-01-17

## 2017-01-17 MED ORDER — NICOTINE 14 MG/24HR TD PT24
1.00 | MEDICATED_PATCH | TRANSDERMAL | Status: DC
Start: 2017-01-18 — End: 2017-01-17

## 2017-01-17 MED ORDER — MORPHINE SULFATE 4 MG/ML IJ SOLN
4.00 mg | INTRAMUSCULAR | Status: DC
Start: ? — End: 2017-01-17

## 2018-07-18 ENCOUNTER — Encounter: Payer: Self-pay | Admitting: Emergency Medicine

## 2018-07-18 ENCOUNTER — Other Ambulatory Visit: Payer: Self-pay

## 2018-07-18 ENCOUNTER — Emergency Department
Admission: EM | Admit: 2018-07-18 | Discharge: 2018-07-18 | Disposition: A | Discharge status: Home / Self Care | Payer: Self-pay | Attending: Emergency Medicine | Admitting: Emergency Medicine

## 2018-07-18 ENCOUNTER — Emergency Department: Payer: Self-pay

## 2018-07-18 DIAGNOSIS — S92355A Nondisplaced fracture of fifth metatarsal bone, left foot, initial encounter for closed fracture: Secondary | ICD-10-CM | POA: Insufficient documentation

## 2018-07-18 DIAGNOSIS — Y92093 Driveway of other non-institutional residence as the place of occurrence of the external cause: Secondary | ICD-10-CM | POA: Insufficient documentation

## 2018-07-18 DIAGNOSIS — Y9301 Activity, walking, marching and hiking: Secondary | ICD-10-CM | POA: Insufficient documentation

## 2018-07-18 DIAGNOSIS — Y999 Unspecified external cause status: Secondary | ICD-10-CM | POA: Insufficient documentation

## 2018-07-18 DIAGNOSIS — X501XXA Overexertion from prolonged static or awkward postures, initial encounter: Secondary | ICD-10-CM | POA: Insufficient documentation

## 2018-07-18 DIAGNOSIS — Z79899 Other long term (current) drug therapy: Secondary | ICD-10-CM | POA: Insufficient documentation

## 2018-07-18 DIAGNOSIS — F17210 Nicotine dependence, cigarettes, uncomplicated: Secondary | ICD-10-CM | POA: Insufficient documentation

## 2018-07-18 MED ORDER — MELOXICAM 15 MG PO TABS: 15.0000 mg | ORAL_TABLET | Freq: Every day | ORAL | 0 refills | Status: DC

## 2018-07-18 MED ORDER — HYDROCODONE-ACETAMINOPHEN 5-325 MG PO TABS: 1.0000 | ORAL_TABLET | ORAL | 0 refills | Status: DC | PRN

## 2018-07-18 MED ORDER — HYDROCODONE-ACETAMINOPHEN 5-325 MG PO TABS
1.0000 | ORAL_TABLET | Freq: Once | ORAL | Status: AC
  Administered 2018-07-18: 1 via ORAL
  Filled 2018-07-18: qty 1

## 2018-07-18 NOTE — ED Notes (Signed)
See triage note   Presents with left foot pain  States he stepped on a rock  Having pain to lateral aspect on foot    Positive swelling noted

## 2018-07-18 NOTE — ED Triage Notes (Signed)
Rolled left ankle on a rock about one hour PTA.  Left ankle swelling noted.

## 2018-07-18 NOTE — ED Provider Notes (Signed)
[facility] Emergency Department Provider Note  ____________________________________________  Time seen: Approximately 6:36 PM  I have reviewed the triage vital signs and the nursing notes.   HISTORY  Chief Complaint Foot Injury    HPI Chevez T Ladnier is a 33 y.o. male who presents the emergency department complaining of injury to the left foot.  Patient reports that he was going outside, stepped on a rock in his driveway rolling his ankle.  Patient reports that he is having pain to the lateral aspect of the foot.  Pain is severe enough that he is unable to bear weight.  No other injury or complaint.  No history of recurrent injuries to the left ankle.  No history of fracture to the left ankle or foot.         History reviewed. No pertinent past medical history.  There are no active problems to display for this patient.   Past Surgical History:  Procedure Laterality Date  . TESTICLE REMOVAL      Prior to Admission medications   Medication Sig Start Date End Date Taking? Authorizing Provider  fluticasone (FLONASE) 50 MCG/ACT nasal spray Place 2 sprays into both nostrils daily. 02/08/16   Menshew, Charlesetta IvoryJenise V Bacon, PA-C  HYDROcodone-acetaminophen (NORCO/VICODIN) 5-325 MG tablet Take 1 tablet by mouth every 4 (four) hours as needed for moderate pain. 07/18/18   , Delorise RoyalsJonathan D, PA-C  meloxicam (MOBIC) 15 MG tablet Take 1 tablet (15 mg total) by mouth daily. 07/18/18   , Delorise RoyalsJonathan D, PA-C    Allergies Tramadol  No family history on file.  Social History Social History   Tobacco Use  . Smoking status: Current Every Day Smoker    Packs/day: 1.00    Types: Cigarettes  . Smokeless tobacco: Never Used  Substance Use Topics  . Alcohol use: Yes    Comment: rare  . Drug use: Yes    Frequency: 5.0 times per week    Types: Marijuana     Review of Systems  Constitutional: No fever/chills Eyes: No visual changes.  Cardiovascular: no chest  pain. Respiratory: no cough. No SOB. Gastrointestinal: No abdominal pain.  No nausea, no vomiting.  Musculoskeletal: Positive for left foot/ankle injury Skin: Negative for rash, abrasions, lacerations, ecchymosis. Neurological: Negative for headaches, focal weakness or numbness. 10-point ROS otherwise negative.  ____________________________________________   PHYSICAL EXAM:  VITAL SIGNS: ED Triage Vitals  Enc Vitals Group     BP 07/18/18 1646 113/77     Pulse Rate 07/18/18 1646 87     Resp 07/18/18 1646 16     Temp 07/18/18 1646 98.3 F (36.8 C)     Temp Source 07/18/18 1646 Oral     SpO2 07/18/18 1646 98 %     Weight 07/18/18 1640 287 lb 14.7 oz (130.6 kg)     Height 07/18/18 1640 5\' 7"  (1.702 m)     Head Circumference --      Peak Flow --      Pain Score 07/18/18 1640 7     Pain Loc --      Pain Edu? --      Excl. in GC? --      Constitutional: Alert and oriented. Well appearing and in no acute distress. Eyes: Conjunctivae are normal. PERRL. EOMI. Head: Atraumatic. Neck: No stridor.    Cardiovascular: Normal rate, regular rhythm. Normal S1 and S2.  Good peripheral circulation. Respiratory: Normal respiratory effort without tachypnea or retractions. Lungs CTAB. Good air entry to the  bases with no decreased or absent breath sounds. Musculoskeletal: Full range of motion to all extremities. No gross deformities appreciated.  Visualization of the left ankle reveals no acute findings.  No gross edema, ecchymosis, abrasions or lacerations.  Good range of motion to the ankle joint.  Examination of the left foot reveals edema around the base of the fifth metatarsal.  No other significant visible findings to the left foot.  Patient is exquisitely tender to palpation of the base of the fifth metatarsal.  No tenderness to palpation of the foot.  Dorsalis pedis pulse intact.  Sensation intact all 5 digits.  Capillary refill less than 2 seconds all digits. Neurologic:  Normal speech and  language. No gross focal neurologic deficits are appreciated.  Skin:  Skin is warm, dry and intact. No rash noted. Psychiatric: Mood and affect are normal. Speech and behavior are normal. Patient exhibits appropriate insight and judgement.   ____________________________________________   LABS (all labs ordered are listed, but only abnormal results are displayed)  Labs Reviewed - No data to display ____________________________________________  EKG   ____________________________________________  RADIOLOGY I personally viewed and evaluated these images as part of my medical decision making, as well as reviewing the written report by the radiologist.  On review of x-ray, disagree with radiologist reading of no acute osseous abnormality.  Identified at the base of the fifth metatarsal is findings consistent with minimally displaced fracture of the fifth metatarsal.  No other identified osseous abnormality on imaging.  Dg Ankle Complete Left  Result Date: 07/18/2018 CLINICAL DATA:  Left ankle pain post rolling injury. EXAM: LEFT ANKLE COMPLETE - 3+ VIEW COMPARISON:  None. FINDINGS: There is no evidence of fracture, dislocation, or joint effusion. There is no evidence of arthropathy or other focal bone abnormality. Mild soft tissue swelling. IMPRESSION: Negative. Electronically Signed   By: Fidela Salisbury M.D.   On: 07/18/2018 18:27    ____________________________________________    PROCEDURES  Procedure(s) performed:    Procedures    Medications  HYDROcodone-acetaminophen (NORCO/VICODIN) 5-325 MG per tablet 1 tablet (has no administration in time range)     ____________________________________________   INITIAL IMPRESSION / ASSESSMENT AND PLAN / ED COURSE  Pertinent labs & imaging results that were available during my care of the patient were reviewed by me and considered in my medical decision making (see chart for details).  Review of the [place] CSRS was performed in  accordance of the [place] prior to dispensing any controlled drugs.           Patient's diagnosis is consistent with fifth metatarsal fracture.  Patient presented to the emergency department complaining of lateral foot pain after stepping on a rock.  Patient had imaging of the left ankle, radiologist appreciated no acute osseous abnormality.  On personal review of imaging, I disagree with radiologist finding as I find evidence of fifth metatarsal fracture.  At this time patient will be placed in a splint, crutches for ambulation.  Limited pain medication and anti-inflammatories for symptom relief.  Follow-up with podiatry..  Patient is given ED precautions to return to the ED for any worsening or new symptoms.     ____________________________________________  FINAL CLINICAL IMPRESSION(S) / ED DIAGNOSES  Final diagnoses:  Closed nondisplaced fracture of fifth metatarsal bone of left foot, initial encounter      NEW MEDICATIONS STARTED DURING THIS VISIT:  ED Discharge Orders         Ordered    HYDROcodone-acetaminophen (NORCO/VICODIN) 5-325 MG tablet  Every  4 hours PRN     07/18/18 1913    meloxicam (MOBIC) 15 MG tablet  Daily     07/18/18 1913              This chart was dictated using voice recognition software/Dragon. Despite best efforts to proofread, errors can occur which can change the meaning. Any change was purely unintentional.    Racheal PatchesCuthriell,  D, PA-C 07/18/18 1915    Phineas SemenGoodman, Gray[place], MD 07/18/18 2043

## 2018-10-23 ENCOUNTER — Ambulatory Visit: Payer: Self-pay | Admitting: Family Medicine

## 2018-10-23 ENCOUNTER — Encounter: Payer: Self-pay | Admitting: Family Medicine

## 2018-10-23 ENCOUNTER — Other Ambulatory Visit: Payer: Self-pay

## 2018-10-23 DIAGNOSIS — Z113 Encounter for screening for infections with a predominantly sexual mode of transmission: Secondary | ICD-10-CM

## 2018-10-23 DIAGNOSIS — Z202 Contact with and (suspected) exposure to infections with a predominantly sexual mode of transmission: Secondary | ICD-10-CM

## 2018-10-23 LAB — GRAM STAIN

## 2018-10-23 MED ORDER — AZITHROMYCIN 500 MG PO TABS
1000.0000 mg | ORAL_TABLET | Freq: Once | ORAL | Status: AC
  Administered 2018-10-23: 12:00:00 1000 mg via ORAL

## 2018-10-23 MED ORDER — CEFTRIAXONE SODIUM 250 MG IJ SOLR
250.0000 mg | Freq: Once | INTRAMUSCULAR | Status: AC
  Administered 2018-10-23: 12:00:00 250 mg via INTRAMUSCULAR

## 2018-10-23 NOTE — Progress Notes (Signed)
  STI clinic/screening visit  Subjective:  Ricky Manning is a 33 y.o. male being seen today for an STI screening visit. The patient reports they do have symptoms.  Patient has the following medical conditions:  There are no active problems to display for this patient.    Chief Complaint  Patient presents with  . SEXUALLY TRANSMITTED DISEASE    HPI  Patient reports increased urinary frequency in the past 2 months. Reports contact to gonorrhea   See flowsheet for further details and programmatic requirements.    The following portions of the patient's history were reviewed and updated as appropriate: allergies, current medications, past medical history, past social history, past surgical history and problem list.  Objective:  There were no vitals filed for this visit.  Physical Exam    Assessment and Plan:  Ricky Manning is a 33 y.o. male presenting to the [facility] for STI screening  1. Screening examination for venereal disease Accepts only penile samples, declines HIV, RPR, Hep B and Hep C Reports contact to GC Treat per standing order for gonorrhea - Gram stain - Gonococcus culture   Return if symptoms worsen or fail to improve.  No future appointments.  Caren Macadam, MD

## 2018-10-23 NOTE — Progress Notes (Signed)
Here today for STD screening. Declines bloodwork. Seldon Barrell, RN ? ?

## 2018-10-28 LAB — GONOCOCCUS CULTURE

## 2018-12-05 ENCOUNTER — Other Ambulatory Visit: Payer: Self-pay | Admitting: *Deleted

## 2018-12-05 DIAGNOSIS — Z20822 Contact with and (suspected) exposure to covid-19: Secondary | ICD-10-CM

## 2018-12-07 LAB — NOVEL CORONAVIRUS, NAA: SARS-CoV-2, NAA: NOT DETECTED

## 2018-12-09 ENCOUNTER — Telehealth: Payer: Self-pay

## 2018-12-09 NOTE — Telephone Encounter (Signed)
Pt instructed how to get results to supervisor. Advised to screen shot that results or print results for the test result page. Pt verbalized understanding.

## 2021-02-10 ENCOUNTER — Emergency Department: Payer: Self-pay

## 2021-02-10 ENCOUNTER — Emergency Department
Admission: EM | Admit: 2021-02-10 | Discharge: 2021-02-10 | Disposition: A | Discharge status: Home / Self Care | Payer: Self-pay | Attending: Emergency Medicine | Admitting: Emergency Medicine

## 2021-02-10 ENCOUNTER — Other Ambulatory Visit: Payer: Self-pay

## 2021-02-10 DIAGNOSIS — M79651 Pain in right thigh: Secondary | ICD-10-CM | POA: Insufficient documentation

## 2021-02-10 DIAGNOSIS — I10 Essential (primary) hypertension: Secondary | ICD-10-CM | POA: Insufficient documentation

## 2021-02-10 DIAGNOSIS — M79604 Pain in right leg: Secondary | ICD-10-CM | POA: Insufficient documentation

## 2021-02-10 DIAGNOSIS — M79652 Pain in left thigh: Secondary | ICD-10-CM | POA: Insufficient documentation

## 2021-02-10 DIAGNOSIS — M79605 Pain in left leg: Secondary | ICD-10-CM | POA: Insufficient documentation

## 2021-02-10 LAB — CBC
HCT: 41.8 % (ref 39.0–52.0)
Hemoglobin: 13.4 g/dL (ref 13.0–17.0)
MCH: 25.6 pg — ABNORMAL LOW (ref 26.0–34.0)
MCHC: 32.1 g/dL (ref 30.0–36.0)
MCV: 79.8 fL — ABNORMAL LOW (ref 80.0–100.0)
Platelets: 336 10*3/uL (ref 150–400)
RBC: 5.24 MIL/uL (ref 4.22–5.81)
RDW: 13.1 % (ref 11.5–15.5)
WBC: 6.8 10*3/uL (ref 4.0–10.5)
nRBC: 0 % (ref 0.0–0.2)

## 2021-02-10 LAB — URINALYSIS, ROUTINE W REFLEX MICROSCOPIC
Bacteria, UA: NONE SEEN
Bilirubin Urine: NEGATIVE
Glucose, UA: NEGATIVE mg/dL
Hgb urine dipstick: NEGATIVE
Ketones, ur: NEGATIVE mg/dL
Leukocytes,Ua: NEGATIVE
Nitrite: NEGATIVE
Protein, ur: NEGATIVE mg/dL
Specific Gravity, Urine: 1.018 (ref 1.005–1.030)
Squamous Epithelial / HPF: NONE SEEN (ref 0–5)
pH: 6 (ref 5.0–8.0)

## 2021-02-10 LAB — BASIC METABOLIC PANEL
Anion gap: 4 — ABNORMAL LOW (ref 5–15)
BUN: 14 mg/dL (ref 6–20)
CO2: 26 mmol/L (ref 22–32)
Calcium: 8.6 mg/dL — ABNORMAL LOW (ref 8.9–10.3)
Chloride: 106 mmol/L (ref 98–111)
Creatinine, Ser: 1.16 mg/dL (ref 0.61–1.24)
GFR, Estimated: >60 mL/min (ref >60)
Glucose, Bld: 118 mg/dL — ABNORMAL HIGH (ref 70–99)
Potassium: 3.6 mmol/L (ref 3.5–5.1)
Sodium: 136 mmol/L (ref 135–145)

## 2021-02-10 LAB — TROPONIN I (HIGH SENSITIVITY): Troponin I (High Sensitivity): 11 ng/L (ref <18)

## 2021-02-10 LAB — CK: Total CK: 286 U/L (ref 49–397)

## 2021-02-10 NOTE — Discharge Instructions (Addendum)
-  Establish with a primary care provider for further evaluation of high blood pressure and diabetes check (ex. [employer], Research scientist (medical), etc.) -Take Tylenol and ibuprofen as needed for pain -Return to the emergency department at any time if you begin to experience any new or worsening symptoms.

## 2021-02-10 NOTE — ED Provider Notes (Signed)
[facility] Provider Note    Event Date/Time   First MD Initiated Contact with Patient 02/10/21 1348     (approximate)   History   Chief Complaint Hypertension and Leg Pain   HPI Ricky Manning is a 36 y.o. male, unremarkable medical history, presents to the emergency department for evaluation of hypertension and bilateral thigh pain.  Patient states that this started 2 days ago.  He says that over the weekend he was loading a truck with several pigs.  The day following, he began to experience severe pain in both of his thighs.  Patient states that he is able to ambulate, though with difficulty.  He additionally notes that his blood pressure is high, although he has not been evaluated by any primary care physician for this.  Denies fever/chills, chest pain, shortness of breath, urinary symptoms, nausea/vomiting, headaches, or numbness/tingling in upper or lower extremities.  History Limitations: No limitations.      Physical Exam  Triage Vital Signs: ED Triage Vitals [02/10/21 1330]  Enc Vitals Group     BP (!) 156/83     Pulse Rate 95     Resp 18     Temp 98.3 F (36.8 C)     Temp Source Oral     SpO2 95 %     Weight 260 lb (117.9 kg)     Height 5\' 7"  (1.702 m)     Head Circumference      Peak Flow      Pain Score 8     Pain Loc      Pain Edu?      Excl. in GC?     Most recent vital signs: Vitals:   02/10/21 1750 02/10/21 1753  BP: (!) 171/101 (!) 175/105  Pulse: 97 81  Resp: 16   Temp: 97.9 F (36.6 C)   SpO2: 99% 97%    General: Awake, NAD.  CV: Good peripheral perfusion.  Resp: Normal effort.   Abd: Soft, non-tender. No distention.  Neuro: At baseline. No gross neurological deficits. Other: Diffuse tenderness when palpating the thighs bilaterally.  No gross deformities.  No notable erythema or swelling.  Pulse, motor, sensation intact distally.  Physical Exam    ED Results / Procedures / Treatments  Labs (all labs  ordered are listed, but only abnormal results are displayed) Labs Reviewed  CBC - Abnormal; Notable for the following components:      Result Value   MCV 79.8 (*)    MCH 25.6 (*)    All other components within normal limits  BASIC METABOLIC PANEL - Abnormal; Notable for the following components:   Glucose, Bld 118 (*)    Calcium 8.6 (*)    Anion gap 4 (*)    All other components within normal limits  URINALYSIS, ROUTINE W REFLEX MICROSCOPIC - Abnormal; Notable for the following components:   Color, Urine YELLOW (*)    APPearance CLEAR (*)    All other components within normal limits  CK  TROPONIN I (HIGH SENSITIVITY)     EKG Sinus rhythm, rate of 89, no remarkable ST segment changes, no axis deviations, no AV blocks   RADIOLOGY  ED Provider Interpretation: I personally reviewed and interpreted these images.  No evidence of DVT.  04/10/21 Venous Img Lower Bilateral  Result Date: 02/10/2021 CLINICAL DATA:  36 year old male with a history of bilateral leg pain EXAM: BILATERAL LOWER EXTREMITY VENOUS DOPPLER ULTRASOUND TECHNIQUE: Gray-scale sonography with graded compression, as  well as color Doppler and duplex ultrasound were performed to evaluate the lower extremity deep venous systems from the level of the common femoral vein and including the common femoral, femoral, profunda femoral, popliteal and calf veins including the posterior tibial, peroneal and gastrocnemius veins when visible. The superficial great saphenous vein was also interrogated. Spectral Doppler was utilized to evaluate flow at rest and with distal augmentation maneuvers in the common femoral, femoral and popliteal veins. COMPARISON:  None. FINDINGS: RIGHT LOWER EXTREMITY Common Femoral Vein: No evidence of thrombus. Normal compressibility, respiratory phasicity and response to augmentation. Saphenofemoral Junction: No evidence of thrombus. Normal compressibility and flow on color Doppler imaging. Profunda Femoral Vein: No  evidence of thrombus. Normal compressibility and flow on color Doppler imaging. Femoral Vein: No evidence of thrombus. Normal compressibility, respiratory phasicity and response to augmentation. Popliteal Vein: No evidence of thrombus. Normal compressibility, respiratory phasicity and response to augmentation. Calf Veins: No evidence of thrombus. Normal compressibility and flow on color Doppler imaging. Superficial Great Saphenous Vein: No evidence of thrombus. Normal compressibility and flow on color Doppler imaging. Other Findings:  None. LEFT LOWER EXTREMITY Common Femoral Vein: No evidence of thrombus. Normal compressibility, respiratory phasicity and response to augmentation. Saphenofemoral Junction: No evidence of thrombus. Normal compressibility and flow on color Doppler imaging. Profunda Femoral Vein: No evidence of thrombus. Normal compressibility and flow on color Doppler imaging. Femoral Vein: No evidence of thrombus. Normal compressibility, respiratory phasicity and response to augmentation. Popliteal Vein: No evidence of thrombus. Normal compressibility, respiratory phasicity and response to augmentation. Calf Veins: No evidence of thrombus. Normal compressibility and flow on color Doppler imaging. Superficial Great Saphenous Vein: No evidence of thrombus. Normal compressibility and flow on color Doppler imaging. Other Findings:  None. IMPRESSION: Directed duplex bilateral lower extremities negative for DVT Electronically Signed   By: Gilmer Mor D.O.   On: 02/10/2021 16:20    PROCEDURES:  Critical Care performed: None.  Procedures    MEDICATIONS ORDERED IN ED: Medications - No data to display   IMPRESSION / MDM / ASSESSMENT AND PLAN / ED COURSE  I reviewed the triage vital signs and the nursing notes.                              Ricky Manning is a 36 y.o. male, unremarkable medical history, presents to the emergency department for evaluation of hypertension and bilateral thigh  pain.  Patient states that this started 2 days ago.  He says that over the weekend he was loading a truck with several pigs.  The day following, he began to experience severe pain in both of his thighs.  Patient states that he is able to ambulate, though with difficulty.  He additionally notes that his blood pressure is high, although he has not been evaluated by any primary care physician for this.   Differential diagnosis includes, but is not limited to, DVT, rhabdomyolysis, muscle strain, PAD.  ED Course Patient appears well.  Vital signs within normal limits.  He is afebrile.  CBC unremarkable for leukocytosis or anemia.  BMP notable for hyperglycemia at 118, otherwise no electrolyte abnormalities  CK total unremarkable at 286, unlikely rhabdomyolysis  Venous ultrasound negative for DVTs.  Initial troponin 11.  Unremarkable EKG.  Unlikely ACS  Assessment/Plan Given the patient's history, physical exam, and work-up thus far, I do not suspect any serious or life-threatening pathology.  Initial concern was for rhabdomyolysis, however his  CK is reassuring.  Patient states he is unable to give [place] a urine sample, however he states that his urine has been clear/yellow.  Lab work is reassuring.  I suspect that the patient is likely just experiencing musculoskeletal strains in his thighs due to recent strenuous work.  Physical exam not concerning for compartment syndrome.  We will plan to discharge this patient.  Advised him to follow-up with a primary care provider for further evaluation of his hypertension.  Patient was provided with anticipatory guidance, return precautions, and educational material. Encouraged the patient to return to the emergency department at any time if they begin to experience any new or worsening symptoms.       FINAL CLINICAL IMPRESSION(S) / ED DIAGNOSES   Final diagnoses:  Hypertension, unspecified type  Bilateral leg pain     Rx / DC Orders   ED Discharge  Orders     None        Note:  This document was prepared using Dragon voice recognition software and may include unintentional dictation errors.   Varney Daily, [place] 02/10/21 1919    Minna Antis, MD 02/13/21 2059

## 2021-02-10 NOTE — Progress Notes (Signed)
Discharge instructions reviewed with patient. Verbalized understanding. No acute distress at this time. Patient to transport himself home.

## 2021-02-10 NOTE — ED Triage Notes (Signed)
Pt here with hypertension and bilateral thigh pain. Pt states that he believes that he has hypertension but has not been seen by a provider to get medication. Pt states that the pain is in the back of of his thighs and is painful while walking. Pt in NAD in triage.

## 2021-02-10 NOTE — ED Notes (Signed)
Pt to ED for pain in both thighs, worse with walking, especially up and down steps. Pt not aware of any strain or new activities that could have caused this pain.  Pt states incidentally he also has HTN but came in for pain in legs.

## 2021-02-10 NOTE — ED Notes (Signed)
Provided urinal for UA

## 2021-03-25 ENCOUNTER — Emergency Department
Admission: EM | Admit: 2021-03-25 | Discharge: 2021-03-25 | Disposition: A | Discharge status: Home / Self Care | Payer: Self-pay | Attending: Student in an Organized Health Care Education/Training Program | Admitting: Student in an Organized Health Care Education/Training Program

## 2021-03-25 ENCOUNTER — Encounter: Payer: Self-pay | Admitting: Emergency Medicine

## 2021-03-25 ENCOUNTER — Emergency Department: Payer: Self-pay

## 2021-03-25 ENCOUNTER — Other Ambulatory Visit: Payer: Self-pay

## 2021-03-25 DIAGNOSIS — R0789 Other chest pain: Secondary | ICD-10-CM | POA: Insufficient documentation

## 2021-03-25 DIAGNOSIS — R051 Acute cough: Secondary | ICD-10-CM | POA: Insufficient documentation

## 2021-03-25 DIAGNOSIS — I10 Essential (primary) hypertension: Secondary | ICD-10-CM | POA: Insufficient documentation

## 2021-03-25 DIAGNOSIS — R5383 Other fatigue: Secondary | ICD-10-CM | POA: Insufficient documentation

## 2021-03-25 DIAGNOSIS — Z87891 Personal history of nicotine dependence: Secondary | ICD-10-CM | POA: Insufficient documentation

## 2021-03-25 LAB — CBC
HCT: 45.8 % (ref 39.0–52.0)
Hemoglobin: 14.9 g/dL (ref 13.0–17.0)
MCH: 25.2 pg — ABNORMAL LOW (ref 26.0–34.0)
MCHC: 32.5 g/dL (ref 30.0–36.0)
MCV: 77.5 fL — ABNORMAL LOW (ref 80.0–100.0)
Platelets: 363 10*3/uL (ref 150–400)
RBC: 5.91 MIL/uL — ABNORMAL HIGH (ref 4.22–5.81)
RDW: 13 % (ref 11.5–15.5)
WBC: 5 10*3/uL (ref 4.0–10.5)
nRBC: 0 % (ref 0.0–0.2)

## 2021-03-25 LAB — BASIC METABOLIC PANEL
Anion gap: 6 (ref 5–15)
BUN: 13 mg/dL (ref 6–20)
CO2: 25 mmol/L (ref 22–32)
Calcium: 9 mg/dL (ref 8.9–10.3)
Chloride: 107 mmol/L (ref 98–111)
Creatinine, Ser: 1.12 mg/dL (ref 0.61–1.24)
GFR, Estimated: >60 mL/min (ref >60)
Glucose, Bld: 103 mg/dL — ABNORMAL HIGH (ref 70–99)
Potassium: 4.3 mmol/L (ref 3.5–5.1)
Sodium: 138 mmol/L (ref 135–145)

## 2021-03-25 LAB — TROPONIN I (HIGH SENSITIVITY)
Troponin I (High Sensitivity): 13 ng/L (ref <18)
Troponin I (High Sensitivity): 13 ng/L (ref <18)

## 2021-03-25 MED ORDER — ALBUTEROL SULFATE HFA 108 (90 BASE) MCG/ACT IN AERS: 2.0000 | INHALATION_SPRAY | Freq: Four times a day (QID) | RESPIRATORY_TRACT | 2 refills | Status: DC | PRN

## 2021-03-25 MED ORDER — IPRATROPIUM-ALBUTEROL 0.5-2.5 (3) MG/3ML IN SOLN
3.0000 mL | Freq: Once | RESPIRATORY_TRACT | Status: AC
Start: 2021-03-25 — End: 2021-03-25
  Administered 2021-03-25: 3 mL via RESPIRATORY_TRACT
  Filled 2021-03-25: qty 3

## 2021-03-25 MED ORDER — AZITHROMYCIN 250 MG PO TABS: ORAL_TABLET | ORAL | 0 refills | Status: AC

## 2021-03-25 NOTE — ED Triage Notes (Signed)
Pt comes into the ED via POV c/o lefts ide chest pain that started Monday/.  Pt denies any cardiac history that he is aware of.  Pt admits to some SHOB and dizziness, but denies any nausea.  Pt currently has even and unlabored respirations and is in NAD.  ?

## 2021-03-25 NOTE — ED Provider Notes (Signed)
? ?[facility] ?Provider Note ? ? ? Event Date/Time  ? First MD Initiated Contact with Patient 03/25/21 1106   ?  (approximate) ? ? ?History  ? ?Chest Pain ? ? ?HPI ? ?Ricky Manning is a 36 y.o. male with a history of high blood pressure not currently on antihypertensive medication as well as history of bronchitis and 1 pack/day smoking history presents to the ER for several days of fatigue chest pain feeling some wheezing just pain in the left anterior no diaphoresis.  Has not noted any lower extremity swelling.  States has had to miss work past 2 days due to the discomfort.  Denies any fevers no cough.  Does have a history of seasonal allergies. ?  ? ? ?Physical Exam  ? ?Triage Vital Signs: ?ED Triage Vitals  ?Enc Vitals Group  ?   BP 03/25/21 1055 (!) 162/112  ?   Pulse Rate 03/25/21 1055 91  ?   Resp 03/25/21 1055 20  ?   Temp 03/25/21 1055 98.2 ?F (36.8 ?C)  ?   Temp src --   ?   SpO2 03/25/21 1055 97 %  ?   Weight 03/25/21 1049 259 lb 14.8 oz (117.9 kg)  ?   Height 03/25/21 1049 5\' 7"  (1.702 m)  ?   Head Circumference --   ?   Peak Flow --   ?   Pain Score 03/25/21 1048 8  ?   Pain Ricky --   ?   Pain Edu? --   ?   Excl. in [place]? --   ? ? ?Most recent vital signs: ?Vitals:  ? 03/25/21 1055 03/25/21 1357  ?BP: (!) 162/112 (!) 158/100  ?Pulse: 91 88  ?Resp: 20 20  ?Temp: 98.2 ?F (36.8 ?C)   ?SpO2: 97% 98%  ? ? ? ?Constitutional: Alert  ?Eyes: Conjunctivae are normal.  ?Head: Atraumatic. ?Nose: No congestion/rhinnorhea. ?Mouth/Throat: Mucous membranes are moist.   ?Neck: Painless ROM.  ?Cardiovascular:   Good peripheral circulation. No g/m/r ?Respiratory: Normal respiratory effort.  No retractions. Coarse bibasilar bs. ?Gastrointestinal: Soft and nontender.  ?Musculoskeletal:  no deformity ?Neurologic:  MAE spontaneously. No gross focal neurologic deficits are appreciated.  ?Skin:  Skin is warm, dry and intact. No rash noted. ?Psychiatric: Mood and affect are normal. Speech and behavior are  normal. ? ? ? ?ED Results / Procedures / Treatments  ? ?Labs ?(all labs ordered are listed, but only abnormal results are displayed) ?Labs Reviewed  ?BASIC METABOLIC PANEL - Abnormal; Notable for the following components:  ?    Result Value  ? Glucose, Bld 103 (*)   ? All other components within normal limits  ?CBC - Abnormal; Notable for the following components:  ? RBC 5.91 (*)   ? MCV 77.5 (*)   ? MCH 25.2 (*)   ? All other components within normal limits  ?TROPONIN I (HIGH SENSITIVITY)  ?TROPONIN I (HIGH SENSITIVITY)  ? ? ? ?EKG ? ?ED ECG REPORT ?I, Merlyn Lot, the attending physician, personally viewed and interpreted this ECG. ? ? Date: 03/25/2021 ? EKG Time: 10:52 ? Rate: 90 ? Rhythm: sinus ? Axis: normal ? Intervals: normal ? ST&T Change: nonspecific st abn, no stemi, poor r wave progression, no change as compared to 02/10/21 ? ? ? ?RADIOLOGY ?Please see ED Course for my review and interpretation. ? ?I personally reviewed all radiographic images ordered to evaluate for the above acute complaints and reviewed radiology reports and findings.  These  findings were personally discussed with the patient.  Please see medical record for radiology report. ? ? ? ?PROCEDURES: ? ?Critical Care performed: No ? ?Procedures ? ? ?MEDICATIONS ORDERED IN ED: ?Medications  ?ipratropium-albuterol (DUONEB) 0.5-2.5 (3) MG/3ML nebulizer solution 3 mL (3 mLs Nebulization Given 03/25/21 1150)  ? ? ? ?IMPRESSION / MDM / ASSESSMENT AND PLAN / ED COURSE  ?I reviewed the triage vital signs and the nursing notes. ?             ?               ? ?Differential diagnosis includes, but is not limited to, ACS, pericarditis, esophagitis, boerhaaves, pe, dissection, pna, bronchitis, costochondritis ? ?Patient presenting with symptoms as described above.  Does have a history of extensive smoking use and some of his presentation concerning for possible bronchitis.  His EKG has nonspecific changes with poor R wave progression but no significant  changes as compared to previous tracing.  Not consistent with STEMI.  Have a lower suspicion for ACS but given his age and risk factors will order serial enzymes.  He is low risk by Wells criteria is PERC negative.  His abdominal exam is soft and benign. ? ? ?Clinical Course as of 03/25/21 1411  ?Wed Mar 25, 2021  ?1127 Chest x-ray area on my review and interpretation shows no evidence of consolidation or Ptx. [PR]  ?1407 Patient reassessed.  Repeat enzyme negative.  Did feel some improvement after nebulizer treatment.  He is having productive cough.  We will treat for bronchitis. [PR]  ?  ?Clinical Course User Index ?[PR] Merlyn Lot, MD  ? ? ? ?FINAL CLINICAL IMPRESSION(S) / ED DIAGNOSES  ? ?Final diagnoses:  ?Atypical chest pain  ?Acute cough  ? ? ? ?Rx / DC Orders  ? ?ED Discharge Orders   ? ?      Ordered  ?  albuterol (VENTOLIN HFA) 108 (90 Base) MCG/ACT inhaler  Every 6 hours PRN       ? 03/25/21 1409  ?  azithromycin (ZITHROMAX Z-PAK) 250 MG tablet       ? 03/25/21 1409  ? ?  ?  ? ?  ? ? ? ?Note:  This document was prepared using Dragon voice recognition software and may include unintentional dictation errors. ? ?  ?Merlyn Lot, MD ?03/25/21 1411 ? ?

## 2021-07-23 ENCOUNTER — Other Ambulatory Visit: Payer: Self-pay

## 2021-07-23 ENCOUNTER — Emergency Department
Admission: EM | Admit: 2021-07-23 | Discharge: 2021-07-23 | Disposition: A | Discharge status: Home / Self Care | Payer: Self-pay | Attending: Emergency Medicine | Admitting: Emergency Medicine

## 2021-07-23 ENCOUNTER — Encounter: Payer: Self-pay | Admitting: Emergency Medicine

## 2021-07-23 DIAGNOSIS — Z23 Encounter for immunization: Secondary | ICD-10-CM | POA: Insufficient documentation

## 2021-07-23 DIAGNOSIS — S61419A Laceration without foreign body of unspecified hand, initial encounter: Secondary | ICD-10-CM

## 2021-07-23 DIAGNOSIS — S61412A Laceration without foreign body of left hand, initial encounter: Secondary | ICD-10-CM | POA: Insufficient documentation

## 2021-07-23 DIAGNOSIS — Y92009 Unspecified place in unspecified non-institutional (private) residence as the place of occurrence of the external cause: Secondary | ICD-10-CM | POA: Insufficient documentation

## 2021-07-23 DIAGNOSIS — W260XXA Contact with knife, initial encounter: Secondary | ICD-10-CM | POA: Insufficient documentation

## 2021-07-23 MED ORDER — LIDOCAINE HCL (PF) 1 % IJ SOLN
5.0000 mL | Freq: Once | INTRAMUSCULAR | Status: AC
  Administered 2021-07-23: 5 mL via INTRADERMAL
  Filled 2021-07-23: qty 5

## 2021-07-23 MED ORDER — TETANUS-DIPHTH-ACELL PERTUSSIS 5-2.5-18.5 LF-MCG/0.5 IM SUSY
0.5000 mL | PREFILLED_SYRINGE | Freq: Once | INTRAMUSCULAR | Status: AC
  Administered 2021-07-23: 0.5 mL via INTRAMUSCULAR
  Filled 2021-07-23: qty 0.5

## 2021-07-23 NOTE — ED Notes (Signed)
See triage note. Pt ambulatory to room. Pt reports he cut his left hand with a knife. Steri strips and gauze applied. Pt in NAD and bleeding controlled.

## 2021-07-23 NOTE — ED Triage Notes (Signed)
Patient arrives ambulatory c/o laceration to left hand from a knife. Patient put steri strips and wrapped it up. Bleeding controlled.

## 2021-07-23 NOTE — Discharge Instructions (Addendum)
Follow-up with either your regular doctor, urgent care, or return the emergency department have sutures removed in 7 to 10 days. Keep the area as dry as possible.  When you get in the shower tomorrow morning you may shower and wash with a mild soap.  Reapply a bandage if you are outside of your home.  If the area is not draining you may remove the bandage at home so the air can help it heal faster.  Tylenol or ibuprofen for pain if needed.  Return if any sign of infection

## 2021-07-23 NOTE — ED Provider Triage Note (Signed)
Emergency Medicine Provider Triage Evaluation Note  Ricky Manning , a 36 y.o. male  was evaluated in triage.  Pt complains of 4 cm well approximated, linear laceration along the dorsal aspect of the left hand sustained with a knife.  Patient reports that injury was accidental.  Review of Systems  Positive: Patient has left hand laceration. Negative: No numbness or tingling in the left hand.  Physical Exam  BP 131/85   Pulse 77   Temp 97.7 F (36.5 C) (Oral)   Resp 16   SpO2 98%  Gen:   Awake, no distress   Resp:  Normal effort  MSK:   Moves extremities without difficulty  Other:    Medical Decision Making  Medically screening exam initiated at 1:00 PM.  Appropriate orders placed.  Ricky Manning was informed that the remainder of the evaluation will be completed by another provider, this initial triage assessment does not replace that evaluation, and the importance of remaining in the ED until their evaluation is complete.     Pia Mau [place], [place] 07/23/21 1301

## 2021-07-23 NOTE — ED Provider Notes (Signed)
[facility] Provider Note    Event Date/Time   First MD Initiated Contact with Patient 07/23/21 1323     (approximate)   History   Laceration   HPI  Ricky Manning is a 36 y.o. male with no significant past medical history presents emergency department with laceration to the left hand dorsum.  Patient states that he was using a knife at home when it slipped and cut himself.  Unsure of his last Tdap      Physical Exam   Triage Vital Signs: ED Triage Vitals  Enc Vitals Group     BP 07/23/21 1258 131/85     Pulse Rate 07/23/21 1258 77     Resp 07/23/21 1258 16     Temp 07/23/21 1258 97.7 F (36.5 C)     Temp Source 07/23/21 1258 Oral     SpO2 07/23/21 1258 98 %     Weight 07/23/21 1300 260 lb (117.9 kg)     Height 07/23/21 1300 5\' 7"  (1.702 m)     Head Circumference --      Peak Flow --      Pain Score 07/23/21 1259 7     Pain Loc --      Pain Edu? --      Excl. in GC? --     Most recent vital signs: Vitals:   07/23/21 1258  BP: 131/85  Pulse: 77  Resp: 16  Temp: 97.7 F (36.5 C)  SpO2: 98%     General: Awake, no distress.   CV:  Good peripheral perfusion. regular rate and  rhythm Resp:  Normal effort. Abd:  No distention.   Other:  Laceration noted on the dorsum of the left hand, no foreign body, bleeding is controlled, neurovascular intact   ED Results / Procedures / Treatments   Labs (all labs ordered are listed, but only abnormal results are displayed) Labs Reviewed - No data to display   EKG     RADIOLOGY     PROCEDURES:   .07/22/23Laceration Repair  Date/Time: 07/23/2021 2:42 PM  Performed by: 07/25/2021, PA-C Authorized by: Faythe Ghee, PA-C   Consent:    Consent obtained:  Verbal   Consent given by:  Patient   Risks, benefits, and alternatives were discussed: yes     Risks discussed:  Infection, pain, retained foreign body, tendon damage, poor cosmetic result, need for additional repair, nerve  damage, poor wound healing and vascular damage   Alternatives discussed:  Delayed treatment Universal protocol:    Procedure explained and questions answered to patient or proxy's satisfaction: yes     Immediately prior to procedure, a time out was called: yes     Patient identity confirmed:  Verbally with patient Anesthesia:    Anesthesia method:  Local infiltration   Local anesthetic:  Lidocaine 1% w/o epi Laceration details:    Location:  Hand   Hand location:  L hand, dorsum   Length (cm):  3 Pre-procedure details:    Preparation:  Patient was prepped and draped in usual sterile fashion Exploration:    Limited defect created (wound extended): no     Hemostasis achieved with:  Direct pressure   Imaging outcome: foreign body not noted     Wound exploration: wound explored through full range of motion     Wound extent: no areolar tissue violation noted, no fascia violation noted, no foreign bodies/material noted, no muscle damage noted, no nerve damage noted,  no tendon damage noted, no underlying fracture noted and no vascular damage noted     Contaminated: no   Treatment:    Area cleansed with:  Povidone-iodine and saline   Amount of cleaning:  Standard   Irrigation solution:  Sterile saline   Irrigation method:  Tap and syringe Skin repair:    Repair method:  Sutures   Suture size:  5-0   Suture material:  Nylon   Suture technique:  Simple interrupted   Number of sutures:  5 Approximation:    Approximation:  Close Repair type:    Repair type:  Simple Post-procedure details:    Dressing:  Non-adherent dressing   Procedure completion:  Tolerated well, no immediate complications    MEDICATIONS ORDERED IN ED: Medications  lidocaine (PF) (XYLOCAINE) 1 % injection 5 mL (5 mLs Intradermal Given 07/23/21 1349)  Tdap (BOOSTRIX) injection 0.5 mL (0.5 mLs Intramuscular Given 07/23/21 1349)     IMPRESSION / MDM / ASSESSMENT AND PLAN / ED COURSE  I reviewed the triage vital  signs and the nursing notes.                              Differential diagnosis includes, but is not limited to, laceration, abrasion, skin tear, contusion  Patient's presentation is most consistent with acute, uncomplicated illness.   Patient has simple laceration to the dorsum.  See procedure note for repair  Patient was given a work note as he works in [employer]ation [employer].  Explained that he cannot use his left hand for 1 week.  Need to keep it dry not wear gloves.  He is to return the emergency department, see his regular doctor or go to urgent care for suture removal in 1 week.  Patient is agreement treatment plan.  His Tdap was updated here in the ED.  He is discharged stable condition.   FINAL CLINICAL IMPRESSION(S) / ED DIAGNOSES   Final diagnoses:  Laceration of dorsum of hand     Rx / DC Orders   ED Discharge Orders     None        Note:  This document was prepared using Dragon voice recognition software and may include unintentional dictation errors.    Faythe Ghee, PA-C 07/23/21 1444    Chesley Noon, MD 07/23/21 Norberta Keens

## 2021-07-23 NOTE — ED Notes (Signed)
Nonstick bandage applied to left hand. Topaz pad not working in room. Pt verbalized understanding dc instructions.

## 2021-10-19 ENCOUNTER — Other Ambulatory Visit: Payer: Self-pay

## 2021-10-19 DIAGNOSIS — R109 Unspecified abdominal pain: Secondary | ICD-10-CM | POA: Insufficient documentation

## 2021-10-19 DIAGNOSIS — Z5321 Procedure and treatment not carried out due to patient leaving prior to being seen by health care provider: Secondary | ICD-10-CM | POA: Insufficient documentation

## 2021-10-19 DIAGNOSIS — K921 Melena: Secondary | ICD-10-CM | POA: Insufficient documentation

## 2021-10-19 LAB — COMPREHENSIVE METABOLIC PANEL
ALT: 12 U/L (ref 0–44)
AST: 17 U/L (ref 15–41)
Albumin: 3.9 g/dL (ref 3.5–5.0)
Alkaline Phosphatase: 68 U/L (ref 38–126)
Anion gap: 7 (ref 5–15)
BUN: 17 mg/dL (ref 6–20)
CO2: 23 mmol/L (ref 22–32)
Calcium: 9.1 mg/dL (ref 8.9–10.3)
Chloride: 107 mmol/L (ref 98–111)
Creatinine, Ser: 1.23 mg/dL (ref 0.61–1.24)
GFR, Estimated: >60 mL/min (ref >60)
Glucose, Bld: 95 mg/dL (ref 70–99)
Potassium: 3.7 mmol/L (ref 3.5–5.1)
Sodium: 137 mmol/L (ref 135–145)
Total Bilirubin: 0.6 mg/dL (ref 0.3–1.2)
Total Protein: 7.3 g/dL (ref 6.5–8.1)

## 2021-10-19 LAB — URINALYSIS, ROUTINE W REFLEX MICROSCOPIC
Bacteria, UA: NONE SEEN
Bilirubin Urine: NEGATIVE
Glucose, UA: NEGATIVE mg/dL
Hgb urine dipstick: NEGATIVE
Ketones, ur: NEGATIVE mg/dL
Nitrite: NEGATIVE
Protein, ur: NEGATIVE mg/dL
Specific Gravity, Urine: 1.012 (ref 1.005–1.030)
Squamous Epithelial / HPF: NONE SEEN (ref 0–5)
pH: 5 (ref 5.0–8.0)

## 2021-10-19 LAB — CBC
HCT: 41.5 % (ref 39.0–52.0)
Hemoglobin: 13.5 g/dL (ref 13.0–17.0)
MCH: 25.4 pg — ABNORMAL LOW (ref 26.0–34.0)
MCHC: 32.5 g/dL (ref 30.0–36.0)
MCV: 78.2 fL — ABNORMAL LOW (ref 80.0–100.0)
Platelets: 352 10*3/uL (ref 150–400)
RBC: 5.31 MIL/uL (ref 4.22–5.81)
RDW: 13.6 % (ref 11.5–15.5)
WBC: 7.1 10*3/uL (ref 4.0–10.5)
nRBC: 0 % (ref 0.0–0.2)

## 2021-10-19 LAB — SAMPLE TO BLOOD BANK

## 2021-10-19 LAB — LIPASE, BLOOD: Lipase: 95 U/L — ABNORMAL HIGH (ref 11–51)

## 2021-10-19 NOTE — ED Triage Notes (Signed)
Pt presents via POV c/o abd pain starting last night after eating. Reports pain has been intermittent all day. Reports had BM with blood in stool.

## 2021-10-20 ENCOUNTER — Emergency Department
Admission: EM | Admit: 2021-10-20 | Discharge: 2021-10-20 | Discharge status: Against Medical Advice | Payer: Self-pay | Attending: Emergency Medicine | Admitting: Emergency Medicine

## 2021-10-20 NOTE — ED Notes (Addendum)
Called pt in waiting room, no answer

## 2021-10-20 NOTE — ED Notes (Signed)
No answer when called several times from lobby 

## 2021-12-31 ENCOUNTER — Emergency Department
Admission: EM | Admit: 2021-12-31 | Discharge: 2021-12-31 | Disposition: A | Discharge status: Home / Self Care | Payer: Self-pay | Attending: Emergency Medicine | Admitting: Emergency Medicine

## 2021-12-31 ENCOUNTER — Other Ambulatory Visit: Payer: Self-pay

## 2021-12-31 DIAGNOSIS — R1110 Vomiting, unspecified: Secondary | ICD-10-CM | POA: Insufficient documentation

## 2021-12-31 DIAGNOSIS — J069 Acute upper respiratory infection, unspecified: Secondary | ICD-10-CM | POA: Insufficient documentation

## 2021-12-31 DIAGNOSIS — B9789 Other viral agents as the cause of diseases classified elsewhere: Secondary | ICD-10-CM

## 2021-12-31 MED ORDER — ONDANSETRON 4 MG PO TBDP: 4.0000 mg | ORAL_TABLET | Freq: Three times a day (TID) | ORAL | 0 refills | Status: AC | PRN

## 2021-12-31 MED ORDER — ALBUTEROL SULFATE HFA 108 (90 BASE) MCG/ACT IN AERS: 2.0000 | INHALATION_SPRAY | Freq: Four times a day (QID) | RESPIRATORY_TRACT | 2 refills | Status: AC | PRN

## 2021-12-31 MED ORDER — BENZONATATE 100 MG PO CAPS: 100.0000 mg | ORAL_CAPSULE | Freq: Three times a day (TID) | ORAL | 0 refills | Status: AC | PRN

## 2021-12-31 MED ORDER — MELOXICAM 15 MG PO TABS: 15.0000 mg | ORAL_TABLET | Freq: Every day | ORAL | 0 refills | Status: AC

## 2021-12-31 NOTE — Discharge Instructions (Addendum)
-  I suspect that your symptoms are likely related to a viral respiratory infection.  -You may take the meloxicam as needed for body aches.  -You may take the benzonatate as needed for cough.  -You may utilize the albuterol inhaler as needed for shortness of breath.  -You may take the ondansetron as needed for nausea/vomiting.  -Follow-up with your primary care provider as needed.  -Return to the emergency department anytime if you begin to experience any new or worsening symptoms.

## 2021-12-31 NOTE — ED Triage Notes (Signed)
C/o flu like symptoms since Tuesday.  Vomiting, chills.  AAOx3.  Skin warm and dry. NAD

## 2021-12-31 NOTE — ED Provider Notes (Signed)
[facility] Provider Note    None    (approximate)   History   Chief Complaint Emesis   HPI Ricky Manning is a 36 y.o. male, no significant medical history, presents to the emergency department for evaluation of flulike symptoms x 3 days.  Reports cough, congestion, body aches, and emesis.  Denies any recent sick contacts.  Reports some chest tightness and mild shortness of breath.  Denies chest pain, abdominal pain, hematemesis, hematochezia, rash/lesions, weakness, or dizziness/lightheadedness.  History Limitations: No limitations.        Physical Exam  Triage Vital Signs: ED Triage Vitals [12/31/21 1736]  Enc Vitals Group     BP      Pulse      Resp      Temp      Temp src      SpO2      Weight 255 lb 15.3 oz (116.1 kg)     Height 5\' 7"  (1.702 m)     Head Circumference      Peak Flow      Pain Score 0     Pain Loc      Pain Edu?      Excl. in [place]?     Most recent vital signs: Vitals:   12/31/21 1838  BP: (!) 167/101  Pulse: 70  Resp: 20  Temp: 98.2 F (36.8 C)  SpO2: 96%    General: Awake, NAD.  Skin: Warm, dry. No rashes or lesions.  Eyes: PERRL. Conjunctivae normal.  CV: Good peripheral perfusion.  Resp: Normal effort.  Lung sounds are clear bilaterally in the apices and bases. Abd: Soft, non-tender. No distention.  Neuro: At baseline. No gross neurological deficits.  Musculoskeletal: Normal ROM of all extremities.   Physical Exam    ED Results / Procedures / Treatments  Labs (all labs ordered are listed, but only abnormal results are displayed) Labs Reviewed  RESP PANEL BY RT-PCR (RSV, FLU A&B, COVID)  [id number]  CBC  COMPREHENSIVE METABOLIC PANEL  LIPASE, BLOOD  URINALYSIS, ROUTINE W REFLEX MICROSCOPIC     EKG N/A.    RADIOLOGY  ED Provider Interpretation: N/A.  No results found.  PROCEDURES:  Critical Care performed: N/A.  Procedures    MEDICATIONS ORDERED IN ED: Medications - No data to  display   IMPRESSION / MDM / ASSESSMENT AND PLAN / ED COURSE  I reviewed the triage vital signs and the nursing notes.                              Differential diagnosis includes, but is not limited to, influenza, COVID-19, RSV, community-acquired pneumonia, bronchitis, viral URI.   Assessment/Plan Presentation consistent with viral respiratory infection.  Offered testing for COVID-19, influenza, or RSV, however did advise him that it would unlikely change management.  He agreed to forego.  Very low suspicion for secondary pneumonia.  No imaging indicated at this time.  Will provide him with medications to help manage symptoms.  Recommend they follow with his primary care provider as needed.  He was agreeable to this plan.  Will discharge.  Provided the patient with anticipatory guidance, return precautions, and educational material. Encouraged the patient to return to the emergency department at any time if they begin to experience any new or worsening symptoms. Patient expressed understanding and agreed with the plan.   Patient's presentation is most consistent with acute complicated illness / injury  requiring diagnostic workup.       FINAL CLINICAL IMPRESSION(S) / ED DIAGNOSES   Final diagnoses:  Viral respiratory infection     Rx / DC Orders   ED Discharge Orders          Ordered    albuterol (VENTOLIN HFA) 108 (90 Base) MCG/ACT inhaler  Every 6 hours PRN        12/31/21 1836    ondansetron (ZOFRAN-ODT) 4 MG disintegrating tablet  Every 8 hours PRN        12/31/21 1836    benzonatate (TESSALON PERLES) 100 MG capsule  3 times daily PRN        12/31/21 1836    meloxicam (MOBIC) 15 MG tablet  Daily        12/31/21 1836             Note:  This document was prepared using Dragon voice recognition software and may include unintentional dictation errors.   Teodoro Spray, [place] 12/31/21 Patrecia Pour    Delman Kitten, MD 01/01/22 [id number]

## 2022-02-19 ENCOUNTER — Emergency Department: Payer: Self-pay

## 2022-02-19 ENCOUNTER — Emergency Department
Admission: EM | Admit: 2022-02-19 | Discharge: 2022-03-05 | Disposition: E | Payer: Self-pay | Attending: Emergency Medicine | Admitting: Emergency Medicine

## 2022-02-19 DIAGNOSIS — R4182 Altered mental status, unspecified: Secondary | ICD-10-CM

## 2022-02-19 DIAGNOSIS — I7103 Dissection of thoracoabdominal aorta: Secondary | ICD-10-CM

## 2022-02-19 DIAGNOSIS — R799 Abnormal finding of blood chemistry, unspecified: Secondary | ICD-10-CM | POA: Insufficient documentation

## 2022-02-19 DIAGNOSIS — I639 Cerebral infarction, unspecified: Secondary | ICD-10-CM

## 2022-02-19 DIAGNOSIS — R2990 Unspecified symptoms and signs involving the nervous system: Secondary | ICD-10-CM

## 2022-02-19 DIAGNOSIS — I7771 Dissection of carotid artery: Secondary | ICD-10-CM

## 2022-02-19 DIAGNOSIS — W08XXXA Fall from other furniture, initial encounter: Secondary | ICD-10-CM | POA: Insufficient documentation

## 2022-02-19 LAB — CBC
HCT: 43.3 % (ref 39.0–52.0)
Hemoglobin: 13.8 g/dL (ref 13.0–17.0)
MCH: 25.1 pg — ABNORMAL LOW (ref 26.0–34.0)
MCHC: 31.9 g/dL (ref 30.0–36.0)
MCV: 78.7 fL — ABNORMAL LOW (ref 80.0–100.0)
Platelets: 295 10*3/uL (ref 150–400)
RBC: 5.5 MIL/uL (ref 4.22–5.81)
RDW: 13.5 % (ref 11.5–15.5)
WBC: 11.1 10*3/uL — ABNORMAL HIGH (ref 4.0–10.5)
nRBC: 0 % (ref 0.0–0.2)

## 2022-02-19 LAB — DIFFERENTIAL
Abs Immature Granulocytes: 0.07 10*3/uL (ref 0.00–0.07)
Basophils Absolute: 0.1 10*3/uL (ref 0.0–0.1)
Basophils Relative: 1 %
Eosinophils Absolute: 0 10*3/uL (ref 0.0–0.5)
Eosinophils Relative: 0 %
Immature Granulocytes: 1 %
Lymphocytes Relative: 22 %
Lymphs Abs: 2.5 10*3/uL (ref 0.7–4.0)
Monocytes Absolute: 1.1 10*3/uL — ABNORMAL HIGH (ref 0.1–1.0)
Monocytes Relative: 10 %
Neutro Abs: 7.4 10*3/uL (ref 1.7–7.7)
Neutrophils Relative %: 66 %

## 2022-02-19 LAB — ETHANOL: Alcohol, Ethyl (B): <10 mg/dL (ref <10)

## 2022-02-19 LAB — CBG MONITORING, ED: Glucose-Capillary: 199 mg/dL — ABNORMAL HIGH (ref 70–99)

## 2022-02-19 LAB — TROPONIN I (HIGH SENSITIVITY): Troponin I (High Sensitivity): 11 ng/L (ref <18)

## 2022-02-19 LAB — PROTIME-INR
INR: 1.4 — ABNORMAL HIGH (ref 0.8–1.2)
Prothrombin Time: 17 seconds — ABNORMAL HIGH (ref 11.4–15.2)

## 2022-02-19 LAB — APTT: aPTT: 41 seconds — ABNORMAL HIGH (ref 24–36)

## 2022-02-19 MED ORDER — ETOMIDATE 2 MG/ML IV SOLN
INTRAVENOUS | Status: AC
  Administered 2022-02-19: 30 mg
  Filled 2022-02-19: qty 20

## 2022-02-19 MED ORDER — ESMOLOL HCL-SODIUM CHLORIDE 2000 MG/100ML IV SOLN
25.0000 ug/kg/min | INTRAVENOUS | Status: DC
  Filled 2022-02-19: qty 100

## 2022-02-19 MED ORDER — IOHEXOL 350 MG/ML SOLN
100.0000 mL | Freq: Once | INTRAVENOUS | Status: AC | PRN
  Administered 2022-02-19: 100 mL via INTRAVENOUS

## 2022-02-19 MED ORDER — DILTIAZEM HCL-DEXTROSE 125-5 MG/125ML-% IV SOLN (PREMIX)
5.0000 mg/h | INTRAVENOUS | Status: DC
  Administered 2022-02-19: 5 mg/h via INTRAVENOUS
  Filled 2022-02-19: qty 125

## 2022-02-19 MED ORDER — LORAZEPAM 2 MG/ML IJ SOLN
INTRAMUSCULAR | Status: AC
  Filled 2022-02-19: qty 1

## 2022-02-19 MED ORDER — FENTANYL 2500MCG IN NS 250ML (10MCG/ML) PREMIX INFUSION: 0.0000 ug/h | INTRAVENOUS | Status: DC

## 2022-02-19 MED ORDER — LORAZEPAM 2 MG/ML IJ SOLN: 2.0000 mg | Freq: Once | INTRAMUSCULAR | Status: DC

## 2022-02-19 MED ORDER — ROCURONIUM BROMIDE 10 MG/ML (PF) SYRINGE
PREFILLED_SYRINGE | INTRAVENOUS | Status: AC
  Filled 2022-02-19: qty 20

## 2022-02-19 MED ORDER — DILTIAZEM LOAD VIA INFUSION
20.0000 mg | Freq: Once | INTRAVENOUS | Status: AC
  Administered 2022-02-19: 20 mg via INTRAVENOUS
  Filled 2022-02-19: qty 20

## 2022-02-19 MED ORDER — FENTANYL 2500MCG IN NS 250ML (10MCG/ML) PREMIX INFUSION
INTRAVENOUS | Status: AC
  Administered 2022-02-19: 50 ug via INTRAVENOUS
  Filled 2022-02-19: qty 250

## 2022-02-19 MED ORDER — PROPOFOL 1000 MG/100ML IV EMUL
INTRAVENOUS | Status: AC
  Filled 2022-02-19: qty 100

## 2022-02-19 MED ORDER — SODIUM CHLORIDE 0.9 % IV BOLUS
1000.0000 mL | Freq: Once | INTRAVENOUS | Status: AC
  Administered 2022-02-19: 1000 mL via INTRAVENOUS

## 2022-02-19 MED ORDER — PHENYLEPHRINE 80 MCG/ML (10ML) SYRINGE FOR IV PUSH (FOR BLOOD PRESSURE SUPPORT)
PREFILLED_SYRINGE | INTRAVENOUS | Status: AC
  Filled 2022-02-19: qty 10

## 2022-02-19 MED ORDER — IOHEXOL 300 MG/ML  SOLN: 100.0000 mL | Freq: Once | INTRAMUSCULAR | Status: DC | PRN

## 2022-02-19 MED ORDER — FENTANYL BOLUS VIA INFUSION: 50.0000 ug | Freq: Once | INTRAVENOUS | Status: AC

## 2022-02-21 MED FILL — Medication: Qty: 1 | Status: AC

## 2022-03-05 NOTE — ED Notes (Signed)
XRAY  POWERSHARE  WITH  [facility] 

## 2022-03-05 NOTE — ED Notes (Signed)
1 epi given

## 2022-03-05 NOTE — ED Notes (Signed)
1 epi and 1 atropine given.

## 2022-03-05 NOTE — ED Triage Notes (Signed)
Coming from home- altered. Friend saw him earlier today. 0950 fell on floor and possible seizure activity. He got up and went to sofa himself then got up and was kneeling. Speech slurred. Pupils unequal. Last known well 0950. Left hand not able to grip and right grip equal. Pt is agitated.

## 2022-03-05 NOTE — ED Provider Notes (Addendum)
[facility] Provider Note    Event Date/Time   First MD Initiated Contact with Patient 2022/03/02 1109     (approximate)   History   Altered Mental Status   HPI  Ricky Manning is a 37 y.o. male unknown past medical history presents to the emergency department for altered mental status.  Patient arrived via EMS.  EMS reports that the patient's last known well was at 950 today.  States that he had a witnessed fall next to the couch.  He was able to get up on his own, stumbled to the couch.  Friend at bedside states that that then found him on the ground next to the couch altered and confused.  No known history of seizures.  Glucose on scene was in the 200s.  Slurred speech with worsening weakness and not moving the left side.  No known history of anticoagulation.  Patient's mother was on scene.  States that the mother possibly is coming to the emergency department.  Patient denies any drug use or history of seizures.     Physical Exam   Triage Vital Signs: ED Triage Vitals  Enc Vitals Group     BP      Pulse      Resp      Temp      Temp src      SpO2      Weight      Height      Head Circumference      Peak Flow      Pain Score      Pain Loc      Pain Edu?      Excl. in [place]?     Most recent vital signs: Vitals:   Mar 02, 2022 1348 Mar 02, 2022 1350  BP:  (!) 211/181  Pulse:  (!) 102  Resp: (!) 45 19  Temp:    SpO2:  (!) 82%    Physical Exam Constitutional:      Appearance: He is well-developed. He is obese.     Comments: Somnolent with snoring respirations, able to follow simple commands but then becomes somnolent again.  Eyes:     Conjunctiva/sclera: Conjunctivae normal.     Pupils: Pupils are equal, round, and reactive to light.     Comments: Unable to follow commands to tract to right or leftward gaze  Cardiovascular:     Rate and Rhythm: Regular rhythm.  Pulmonary:     Effort: No respiratory distress.  Musculoskeletal:     Cervical  back: Normal range of motion.  Skin:    General: Skin is warm.  Neurological:     Mental Status: He is disoriented.     Comments: Facial droop, slurred speech, weakness to the left upper and lower extremity, unable to hold to gravity.  5/5 strength bilateral upper and lower extremities.  Gait deferred.     IMPRESSION / MDM / ASSESSMENT AND PLAN / ED COURSE  I reviewed the triage vital signs and the nursing notes.  On arrival to the emergency department patient altered with left-sided deficits.  Reports from EMS as last known well at 950 today.  Patient was activated code stroke.  Neurology with Dr. Quinn Axe immediately at bedside.  Patient taken to CT scanner.  CT scan without an obvious intracranial hemorrhage.  After further information from the patient's mother who arrived to the emergency department patient's last known well was last night.  Patient is outside of the window for TNK.  Concern for LVO so we will obtain a CTA.  Significant altered mental status so was given IV Ativan for CTA.  Differential diagnosis including CVA with LVO, seizure with Todd's paralysis, electrolyte abnormality, metabolic encephalopathy, status epilepticus  EKG  I, Nathaniel Man, the attending physician, personally viewed and interpreted this ECG.   Rate: 41  Rhythm: Sinus bradycardia  Axis: Normal  Intervals: Normal  ST&T Change: None  Sinus bradycardia followed by sinus tachycardia while on cardiac telemetry  RADIOLOGY I independently reviewed imaging, my interpretation of imaging: CT head with no signs of intracranial hemorrhage.    LABS (all labs ordered are listed, but only abnormal results are displayed) Labs interpreted as -    Labs Reviewed  PROTIME-INR - Abnormal; Notable for the following components:      Result Value   Prothrombin Time 17.0 (*)    INR 1.4 (*)    All other components within normal limits  APTT - Abnormal; Notable for the following components:   aPTT 41 (*)     All other components within normal limits  CBC - Abnormal; Notable for the following components:   WBC 11.1 (*)    MCV 78.7 (*)    MCH 25.1 (*)    All other components within normal limits  DIFFERENTIAL - Abnormal; Notable for the following components:   Monocytes Absolute 1.1 (*)    All other components within normal limits  CBG MONITORING, ED - Abnormal; Notable for the following components:   Glucose-Capillary 199 (*)    All other components within normal limits  ETHANOL  COMPREHENSIVE METABOLIC PANEL  TROPONIN I (HIGH SENSITIVITY)  TROPONIN I (HIGH SENSITIVITY)    TREATMENT  1 L of IV fluids, etomidate and rocuronium for RSI, propofol and fentanyl for post sedation.  MDM  Multiple attempts to attain a CT angiography to evaluate for LVO however significant agitation, multiple attempts including putting on lead to hold the patient still however ultimately patient was snoring respirations and need for an airway protection.  Patient was taken back to the room for intubation.  After intubation patient taken back for CTA and perfusion study to evaluate for LVO.  Patient was intubated for airway protection given concern for LVO.  CT scan concerning for large vessel cutoff.  Dr. Quinn Axe with neurology consulted [facility] for IR and transfer.  Patient's mother in the emergency department and updated on the patient's status.  Clinical Course as of 02/25/2022 1435  02/25/2022 Brought back from CT scanner, no pulse in LLE.  Concern for dissection after discussion with radiology.  Immediately taken back to Ct scanner for dissection protocol.  [SM]    Clinical Course User Index [SM] Nathaniel Man, MD   After discussion with radiology to Dr. Quinn Axe with CTA concern for intimal flap of the aorta and possible dissection.  Patient immediately taken back for CTA chest abdomen and pelvis given concern for proximal dissection.  Reviewed CTA and immediately paged out to [facility] CT surgery for  concern of a proximal dissection.  Discussed with CT surgery Dr. Luther Redo who accepted the patient in transfer to the ICU.  Stated that the patient would not be a surgical candidate given cerebral infarct in need of heparin, stated that if the patient came over to their ICU would have palliative consult and goals of care consult.  Patient hypertensive, increased propofol and fentanyl.  Multiple episodes of bradycardia and hypotension followed by tachycardia and elevated blood pressure.  Multiple attempts  to titrate pain medication and sedation medication for hypertension.  Heart rate ranging from 30-110.  Holding esmolol secondary to low heart rate.  Patient continued to have a blood pressure of [id number] systolic, given 1 dose of IV diltiazem.  After accepted to outside hospital patient became bradycardic, given atropine, lost pulses and initiated CPR.  Patient's mother and father at bedside.  CPR with epinephrine, calcium and atropine given, unable to do obtained ROSC, patient expired.  Consulted medical examiner.  Time of death: [id number]    PROCEDURES:  Critical Care performed: Yes  .Critical Care  Performed by: Nathaniel Man, MD Authorized by: Nathaniel Man, MD   Critical care provider statement:    Critical care time (minutes):  110   Critical care time was exclusive of:  Separately billable procedures and treating other patients   Critical care was necessary to treat or prevent imminent or life-threatening deterioration of the following conditions:  CNS failure or compromise   Critical care was time spent personally by me on the following activities:  Development of treatment plan with patient or surrogate, discussions with consultants, evaluation of patient's response to treatment, examination of patient, ordering and review of laboratory studies, ordering and review of radiographic studies, ordering and performing treatments and interventions, pulse oximetry, re-evaluation of patient's condition  and review of old charts Procedure Name: Intubation Date/Time: 02-26-2022 12:33 PM  Performed by: Nathaniel Man, MDPre-anesthesia Checklist: Suction available, Emergency Drugs available, Patient identified, Patient being monitored and Timeout performed Oxygen Delivery Method: Ambu bag Preoxygenation: Pre-oxygenation with 100% oxygen Induction Type: Rapid sequence Ventilation: Mask ventilation without difficulty Laryngoscope Size: Glidescope and 3 Grade View: Grade I Tube size: 7.5 mm Number of attempts: 1 Airway Equipment and Method: Video-laryngoscopy Placement Confirmation: ETT inserted through vocal cords under direct vision, Positive ETCO2, CO2 detector and Breath sounds checked- equal and bilateral Secured at: 25 cm Tube secured with: ETT holder Dental Injury: Teeth and Oropharynx as per pre-operative assessment  Future Recommendations: Recommend- induction with short-acting agent, and alternative techniques readily available    CPR  Date/Time: 2022/02/26 2:05 PM  Performed by: Nathaniel Man, MD Authorized by: Nathaniel Man, MD  CPR Procedure Details:    CPR/ACLS performed in the ED: Yes     Duration of CPR (minutes):  17   Outcome: Pt declared dead    CPR performed via ACLS guidelines under my direct supervision.  See RN documentation for details including defibrillator use, medications, doses and timing. Comments:     Multiple pulse checks, administered epinephrine, calcium and atropine x 2.  PEA on pulse checks.   Patient's presentation is most consistent with acute presentation with potential threat to life or bodily function.   MEDICATIONS ORDERED IN ED: Medications  LORazepam (ATIVAN) injection 2 mg (has no administration in time range)  LORazepam (ATIVAN) 2 MG/ML injection (has no administration in time range)  rocuronium bromide 100 MG/10ML SOSY (has no administration in time range)  propofol (DIPRIVAN) 1000 MG/100ML infusion (has no administration in time  range)  fentaNYL 251mg in NS 2515m(1052mml) infusion-PREMIX (has no administration in time range)  esmolol (BREVIBLOC) 2000 mg / 100 mL (20 mg/mL) infusion (has no administration in time range)  diltiazem (CARDIZEM) 1 mg/mL load via infusion 20 mg (20 mg Intravenous Bolus from Bag 2/1Feb 23, 202422)    And  diltiazem (CARDIZEM) 125 mg in dextrose 5% 125 mL (1 mg/mL) infusion (0 mg/hr Intravenous Stopped 2/1February 23, 202436)  iohexol (OMNIPAQUE) 350 MG/ML injection 100 mL (100 mLs Intravenous  Contrast Given 2022/03/08 1138)  etomidate (AMIDATE) 2 MG/ML injection (30 mg  Given 08-Mar-2022 1201)  sodium chloride 0.9 % bolus 1,000 mL (0 mLs Intravenous Stopped 03/08/22 1311)  fentaNYL (SUBLIMAZE) bolus via infusion 50 mcg (50 mcg Intravenous Bolus from Bag Mar 08, 2022 1211)  iohexol (OMNIPAQUE) 350 MG/ML injection 100 mL (100 mLs Intravenous Contrast Given Mar 08, 2022 1226)  iohexol (OMNIPAQUE) 350 MG/ML injection 100 mL (100 mLs Intravenous Contrast Given 03/08/2022 1248)    FINAL CLINICAL IMPRESSION(S) / ED DIAGNOSES   Final diagnoses:  Altered mental status, unspecified altered mental status type  Stroke-like symptoms  Cerebrovascular accident (CVA), unspecified mechanism ([place])  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   Nathaniel Man, MD 03/08/22 1234    Nathaniel Man, MD 03-08-2022 1312    Nathaniel Man, MD 03-08-2022 1435

## 2022-03-05 NOTE — ED Notes (Signed)
1G calcium given IVP at this time. Mom at bedside with EDP discussing his current state

## 2022-03-05 NOTE — ED Notes (Signed)
1 atropine given.

## 2022-03-05 NOTE — Consult Note (Signed)
NEUROLOGY CONSULTATION NOTE   Date of service: 03/10/2022 Patient Name: Ricky Manning MRN:  [id number] DOB:  May 28, 1985 Reason for consult: L hemiplegia R gaze deviation - stroke code Requesting physician: Dr. Nathaniel Man _ _ _   _ __   _ __ _ _  __ __   _ __   __ _  History of Present Illness   This is a 37 yo man with hx HTN who is BIB EMS for AMS. Hx obtained from mother by phone.  Last known well was 11:30 PM when he arrived home from work last night.  This morning the first time anyone saw him is when he walked into a room where his brother was sitting approximately 9:50 AM.  Patient told his brother that he did not feel well and then fell on the floor.  He may have had seizure activity at that point although it is not specified with that looks like.  He was eventually able to get himself to a kneeling position and maneuver onto the sofa.  His speech was slurred.  He was noted to have left-sided weakness and was unable to grip on the left.  On arrival to the ED he had right gaze deviation and LUE>LLE weakness.  He was. NIHSS = 17. Patient was immediately taken to CT however he was not hemodynamically stable to undergo scan for several minutes. He then became agitated, requiring ativan to undergo noncon head CT. On personal review, no ICH and no acute process. LKW 1130PM last night therefore TNK not administered 2/2 patient being outside of the window. CTA H&N was attempted 2/2 concern for large vessel occlusion. Images were highly motion degraded but did show a R ICA occlusion in the neck. Most sequences were non-diagnostic 2/2 motion. Patient was intubated and repeat CTA showed non-opacification of the R common and internal carotid arteries with suggestion of intimal flap in aorta raising concern for aortic dissection. CTA chest confirmed a type a dissection beginning in the aortic root continuing through the entire thoracic and abdominal aorta.   All CNS imaging was personally reviewed  and discussed with radiology by phone.    ROS   UTA 2/2 mental status  Past History   I have reviewed the following:  No past medical history on file. Past Surgical History:  Procedure Laterality Date   SMALL INTESTINE SURGERY  01/2017   Reanatamosis of small intestine   TESTICLE REMOVAL     Family History  Problem Relation Age of Onset   Diabetes Paternal Grandmother    Social History   Socioeconomic History   Marital status: Single    Spouse name: Not on file   Number of children: Not on file   Years of education: Not on file   Highest education level: Not on file  Occupational History   Not on file  Tobacco Use   Smoking status: Every Day    Packs/day: 1.00    Types: Cigarettes   Smokeless tobacco: Never  Vaping Use   Vaping Use: Never used  Substance and Sexual Activity   Alcohol use: Yes    Comment: rare   Drug use: Yes    Frequency: 5.0 times per week    Types: Marijuana, Cocaine   Sexual activity: Yes    Birth control/protection: Condom  Other Topics Concern   Not on file  Social History Narrative   Not on file   Social Determinants of Health   Financial Resource Strain: Not  on file  Food Insecurity: Not on file  Transportation Needs: Not on file  Physical Activity: Not on file  Stress: Not on file  Social Connections: Not on file   Allergies  Allergen Reactions   Tramadol Nausea And Vomiting    v    Medications   (Not in a hospital admission)     Current Facility-Administered Medications:    LORazepam (ATIVAN) 2 MG/ML injection, , , ,    LORazepam (ATIVAN) injection 2 mg, 2 mg, Intravenous, Once, Derek Jack, MD  Current Outpatient Medications:    acetaminophen (TYLENOL) 500 MG tablet, Take 1,000 mg by mouth every 6 (six) hours as needed for mild pain or moderate pain., Disp: , Rfl:    albuterol (VENTOLIN HFA) 108 (90 Base) MCG/ACT inhaler, Inhale 2 puffs into the lungs every 6 (six) hours as needed for wheezing or shortness  of breath., Disp: 8 g, Rfl: 2   amLODipine (NORVASC) 10 MG tablet, Take 1 tablet by mouth daily., Disp: , Rfl:    benzonatate (TESSALON PERLES) 100 MG capsule, Take 1 capsule (100 mg total) by mouth 3 (three) times daily as needed for cough., Disp: 30 capsule, Rfl: 0   Multiple Vitamin (MULTI-VITAMINS) TABS, Take 1 tablet by mouth daily., Disp: , Rfl:    ondansetron (ZOFRAN-ODT) 4 MG disintegrating tablet, Take 1 tablet (4 mg total) by mouth every 8 (eight) hours as needed for nausea or vomiting., Disp: 20 tablet, Rfl: 0  Vitals   There were no vitals filed for this visit.   There is no height or weight on file to calculate BMI.  Physical Exam   Physical Exam Gen: alert, oriented to first name only, follows some simple commands Resp: CTAB, no w/r/r CV: RRR  Neuro: *MS: alert, oriented to first name only, follows some simple commands *Speech: moderate dysarthria, no aphasia *CN:    I: Deferred   II,III: PERRLA, VFF by confrontation, optic discs unable to be visualized 2/2 pupillary constriction   III,IV,VI: R gaze deviation   V: Sensation intact from V1 to V3 to LT   VII: Eyelid closure was full.  L UMN facial droop   VIII: Hearing intact to voice   IX,X: Voice normal, palate elevates symmetrically    XI: SCM/trap 5/5 bilat   XII: Tongue protrudes midline, no atrophy or fasciculations  *Motor:   No movement LUE. No movement against gravity LLE. RUE and RLE full strength. *Sensory: L sensory deficit to LT. Visual neglect on L. *Coordination:  UTA 2/2 mental status *Reflexes:  2+ and symmetric throughout without clonus; toes down-going bilat *Gait: deferred  NIHSS  1a Level of Conscious.: 0 1b LOC Questions: 2 1c LOC Commands: 1 2 Best Gaze: 2 3 Visual: 0 4 Facial Palsy: 2 5a Motor Arm - left: 4 5b Motor Arm - Right: 0 6a Motor Leg - Left: 3 6b Motor Leg - Right: 0 7 Limb Ataxia: 0 8 Sensory: 1 9 Best Language: 0 10 Dysarthria: 1 11 Extinct. and Inatten.:  0  TOTAL: 17  Premorbid mRS = 0   Labs   CBC:  Recent Labs  Lab 2022/03/20 1111  WBC 11.1*  NEUTROABS 7.4  HGB 13.8  HCT 43.3  MCV 78.7*  PLT [id number]    Basic Metabolic Panel:  Lab Results  Component Value Date   NA 137 10/19/2021   K 3.7 10/19/2021   CO2 23 10/19/2021   GLUCOSE 95 10/19/2021   BUN 17 10/19/2021   CREATININE 1.23  10/19/2021   CALCIUM 9.1 10/19/2021   GFRNONAA >60 10/19/2021   GFRAA >60 01/14/2017   Lipid Panel: No results found for: "LDLCALC" HgbA1c: No results found for: "HGBA1C" Urine Drug Screen: No results found for: "LABOPIA", "COCAINSCRNUR", "LABBENZ", "AMPHETMU", "THCU", "LABBARB"  Alcohol Level     Component Value Date/Time   ETH <10 02-27-22 1111     Impression & Recommendations   This is a 37 yo man with hx HTN who is BIB EMS for AMS, R gaze deviation, and L hemiplegia initially concerning for ICH or large vessel occlusion ischemic stroke. NIHSS = 17. CT head showed no ICH. CTA H&N was attempted 2/2 concern for large vessel occlusion. Images were highly motion degraded but did show a R ICA occlusion in the neck. Most sequences were non-diagnostic 2/2 motion. Patient was intubated and repeat CTA showed non-opacification of the R common and internal carotid arteries with suggestion of intimal flap in aorta raising concern for aortic dissection. CTA chest confirmed a type a dissection beginning in the aortic root continuing through the entire thoracic and abdominal aorta. ED attempted to arrange transfer for vascular intervention but patient went into cardiac arrest and passed away.  This patient is critically ill and at significant risk of neurological worsening, death and care requires constant monitoring of vital signs, hemodynamics,respiratory and cardiac monitoring, neurological assessment, discussion with family, other specialists and medical decision making of high complexity. I spent 120 minutes of neurocritical care time  in the care of   this patient. This was time spent independent of any time provided by nurse practitioner or PA.  Su Monks, MD [facility] [phone]  If 7pm- 7am, please page neurology on call as listed in [place].

## 2022-03-05 NOTE — ED Notes (Signed)
[facility]  CALLED   PER  DR  [facility]  MD

## 2022-03-05 NOTE — ED Notes (Signed)
Pt noted to have no pedial pulse in R foot. EDP made aware- pt returned to CT for dissection study.

## 2022-03-05 NOTE — ED Notes (Signed)
Proceeding with intubation: 1200:30 mg etomidate pushed at this time. 1201: 160 roc pushed IV at this time.  1202: pt tubed ETT 25 at lips 7.5 - equal breath sounds, cap color change.

## 2022-03-05 NOTE — Progress Notes (Signed)
CODE STROKE- PHARMACY COMMUNICATION   Time CODE STROKE called/page received:1110  Time response to CODE STROKE was made (in person or via phone): 1113  Time Stroke Kit retrieved from [place] (only if needed): 1110  Name of Provider/Nurse contacted:Dr. Su Monks, MD - no TNK indicated   No past medical history on file. Prior to Admission medications   Medication Sig Start Date End Date Taking? Authorizing Provider  acetaminophen (TYLENOL) 500 MG tablet Take 1,000 mg by mouth every 6 (six) hours as needed for mild pain or moderate pain.    [provider]  albuterol (VENTOLIN HFA) 108 (90 Base) MCG/ACT inhaler Inhale 2 puffs into the lungs every 6 (six) hours as needed for wheezing or shortness of breath. 12/31/21   Teodoro Spray, PA  amLODipine (NORVASC) 10 MG tablet Take 1 tablet by mouth daily. 02/05/17   [provider]  benzonatate (TESSALON PERLES) 100 MG capsule Take 1 capsule (100 mg total) by mouth 3 (three) times daily as needed for cough. 12/31/21 12/31/22  Teodoro Spray, PA  Multiple Vitamin (MULTI-VITAMINS) TABS Take 1 tablet by mouth daily.    [provider]  ondansetron (ZOFRAN-ODT) 4 MG disintegrating tablet Take 1 tablet (4 mg total) by mouth every 8 (eight) hours as needed for nausea or vomiting. 12/31/21   Teodoro Spray, PA    Dorothe Pea, PharmD, BCPS Clinical Pharmacist   2022-03-14  11:10 AM

## 2022-03-05 NOTE — ED Notes (Signed)
Pt began to brady down into 30's and 20's- no pulse felt. CPR started

## 2022-03-05 NOTE — ED Notes (Signed)
CALLED  [facility]  TO  [facility]  TRANSFER  PER  DR  [facility]  MD

## 2022-03-05 NOTE — ED Notes (Signed)
1 epi in at this time. Family and EDP at bedside. Support given.

## 2022-03-05 NOTE — ED Notes (Signed)
Pulse check at this time and no pulse felt- CPR resumed with 30 sec interruption; RT continuing to bag

## 2022-03-05 NOTE — ED Notes (Signed)
[facility]  CALLED  PER  DR  [facility]  MD

## 2022-03-05 NOTE — Code Documentation (Signed)
Stroke Response Nurse Documentation Code Documentation  Ricky Manning is a 37 y.o. male arriving to [facility] via [facility] on 2022-03-04 with past medical hx of DM, HTN. On No antithrombotic. Code stroke was activated by ED.   Patient from home where he was LKW at 2/15 evening around 2330 after work by family and now complaining of AMS. Per EMS family witnessed patient have a stumble/fall around 69. He was able to get up and get to the couch but was later found on the floor, incontinent on urine, left sided weakness and facial droop. EMS was called.    Stroke team meets patient in CT after code stroke notification. Labs drawn and patient cleared for CT by Dr. Jori Moll. Patient to CT with team. NIHSS 17, see documentation for details and code stroke times. Patient with disoriented, not following commands, right gaze preference , left facial droop, left arm weakness, left leg weakness, left decreased sensation, dysarthria , and Visual  neglect on exam. The following imaging was completed:  CT Head and CTA. Patient is not a candidate for IV Thrombolytic due to outside window per MD. Patient back to room requiring intubation and taken back for repeat CTA after intubation Patient is not a candidate for IR due to no LVO on imaging per MD. CTA concerning for possible dissection per MD.   Care Plan: EDP and neurology contacting [facility] for potential transfer. Q2h NIHSS and vital signs.   Bedside handoff with ED RN Benjamine Mola.    Charise Carwin  Stroke Response RN

## 2022-03-05 NOTE — ED Notes (Signed)
CBG 199

## 2022-03-05 NOTE — ED Notes (Signed)
Pulse check and no pulse found. EDP and team determined no more efforts or ideas available to resuscitate. TOD 1349. Family at bedside. Support given.

## 2022-03-05 NOTE — ED Notes (Signed)
Time out performed. Pt pre oxygenated, RT at bedside, atropine at bedside, phenylephrine at bedside. RSI drugs 30 etomidate and 160 roc prepared at this time.

## 2022-03-05 NOTE — Progress Notes (Signed)
   2022-02-23 1200  Spiritual Encounters  Type of Visit Initial  Care provided to: Pt and family  Conversation partners present during encounter Nurse;Physician  Referral source Code page  Reason for visit Code  OnCall Visit Yes  Spiritual Framework  Presenting Themes Impactful experiences and emotions  Values/beliefs faith  Community/Connection Family;Friend(s);Faith community  Needs/Challenges/Barriers unknown diagnosis  Family Stress Factors Exhausted   Chaplain responded to code stoke page. Chaplain provided support to mother while patient was in scans. Chaplain provided compassionate presence and reflective listening as mother spoke about patient's health challenges. Mother appreciated Chaplain visit.

## 2022-03-05 NOTE — ED Notes (Signed)
Received a bed assignment for patient at [facility] Thoracic ICU [phone] RN aware

## 2022-03-05 NOTE — ED Notes (Signed)
Code  stroke  called  to  [facility]

## 2022-03-05 NOTE — ED Notes (Addendum)
1 epi given at this time; family given support and resuscitative efforts explained.

## 2022-03-05 NOTE — ED Notes (Signed)
Code started

## 2022-03-05 NOTE — ED Notes (Addendum)
Pt returned and preparations made to intubate for airway protection for better CT

## 2022-03-05 NOTE — ED Notes (Signed)
Pt too agitated to complete scan- moving frequently despite redirection- returned back to room to intubate. Pt appears to be trying to follow commands.

## 2022-03-05 NOTE — ED Notes (Signed)
Pulse check and 1 epi given. No pulses felt

## 2022-03-05 NOTE — Progress Notes (Signed)
   March 16, 2022 1400  Spiritual Encounters  Type of Visit Follow up  Care provided to: Pt and family   Chaplain responded to EOL. Chaplain supported many family members amid death of patient.

## 2022-03-05 DEATH — deceased

## 2023-04-09 IMAGING — US US EXTREM LOW VENOUS
1 series · 13 of 24 positions shown · non-contrast
Comparison: None.

CLINICAL DATA: 35-year-old male with a history of bilateral leg
pain



[Series 1: us extrem low venous · 0.08mm/px · 13 of 56 slices shown]
[im 1/56]
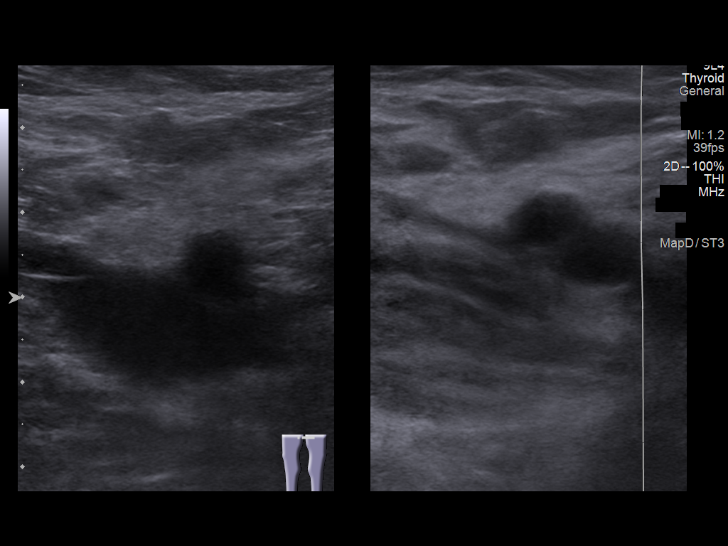
[im 5/56]
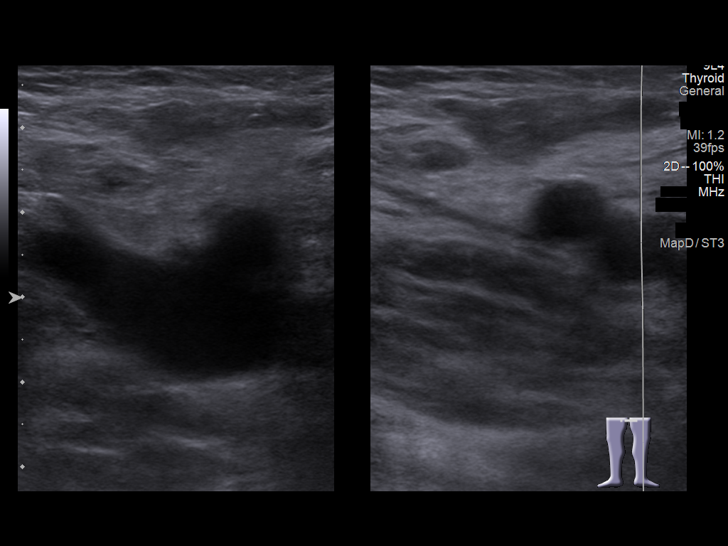
[im 10/56]
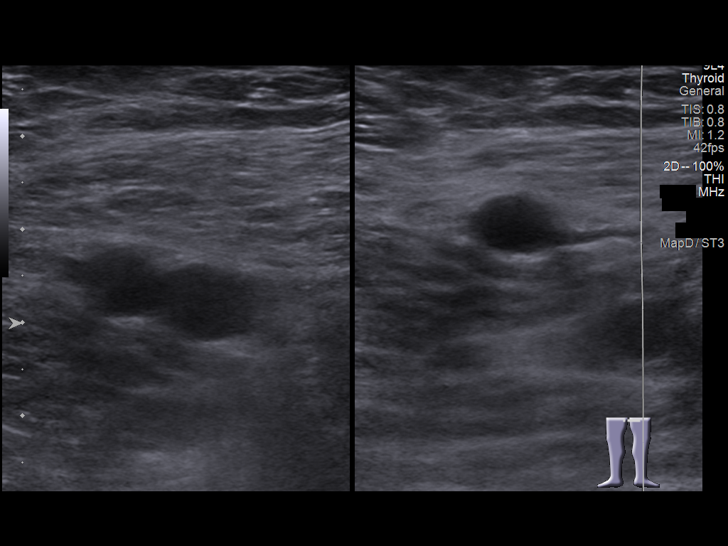
[im 15/56]
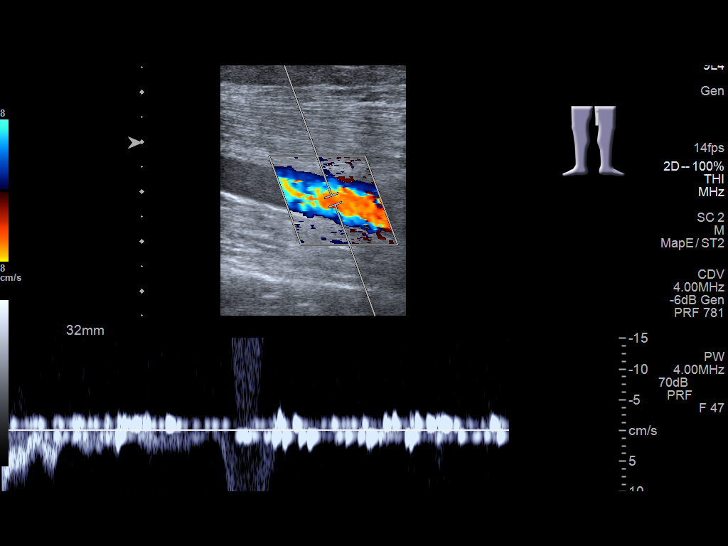
[im 20/56]
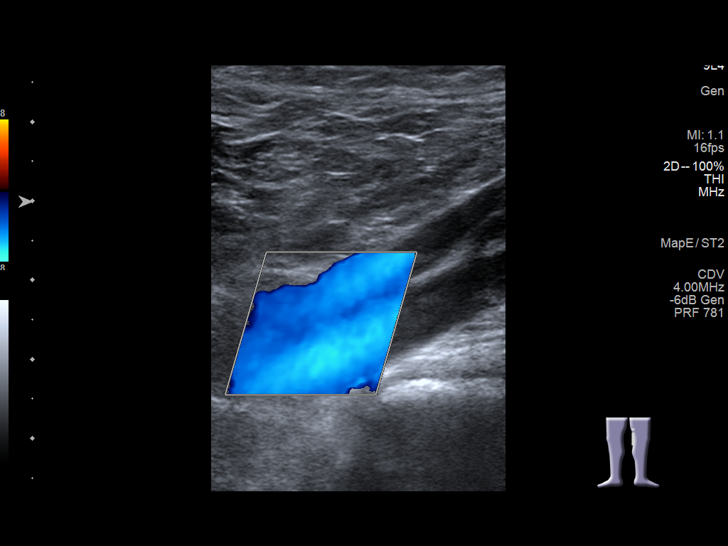
[im 24/56]
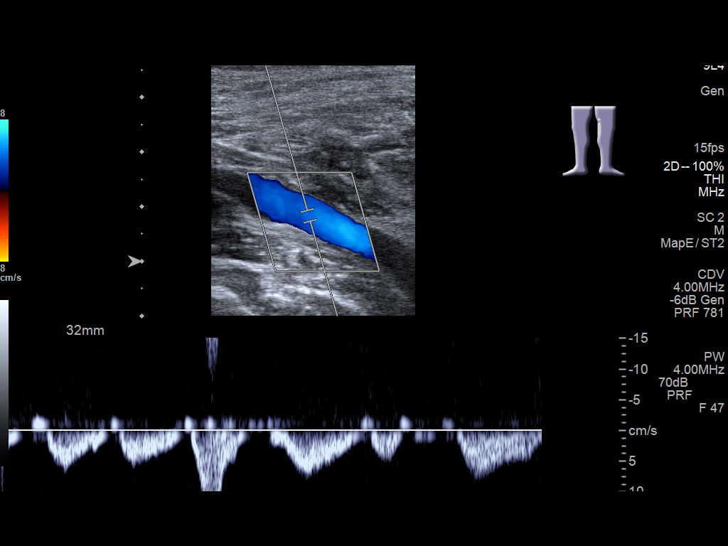
[im 29/56]
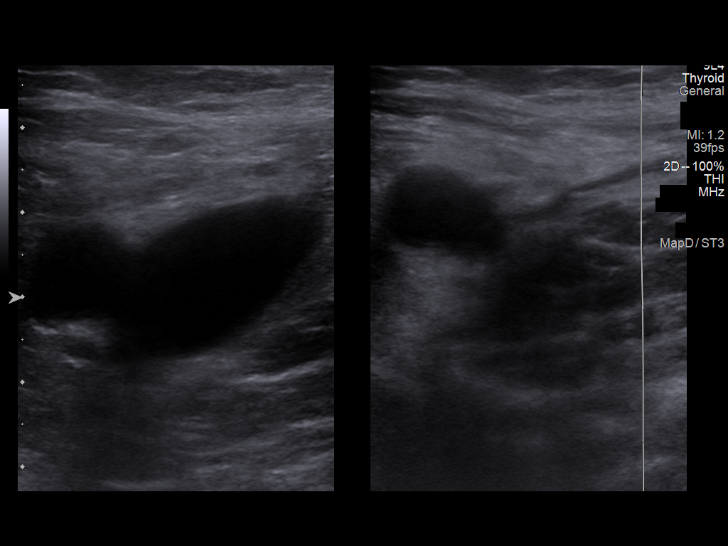
[im 32/56]
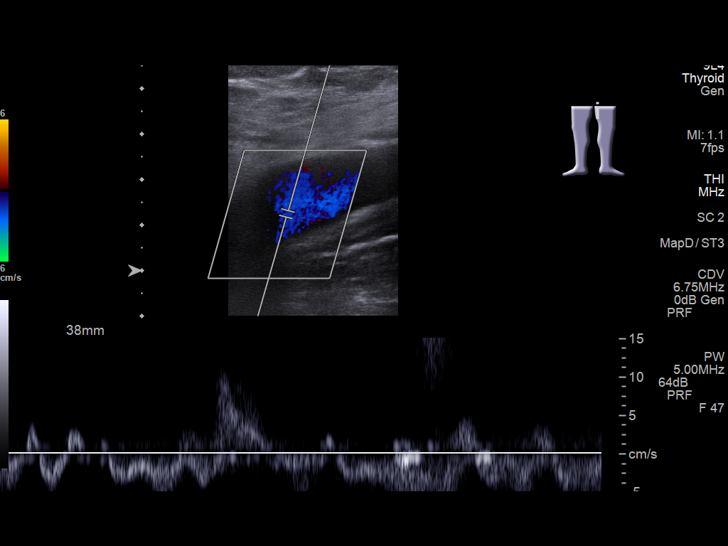
[im 36/56]
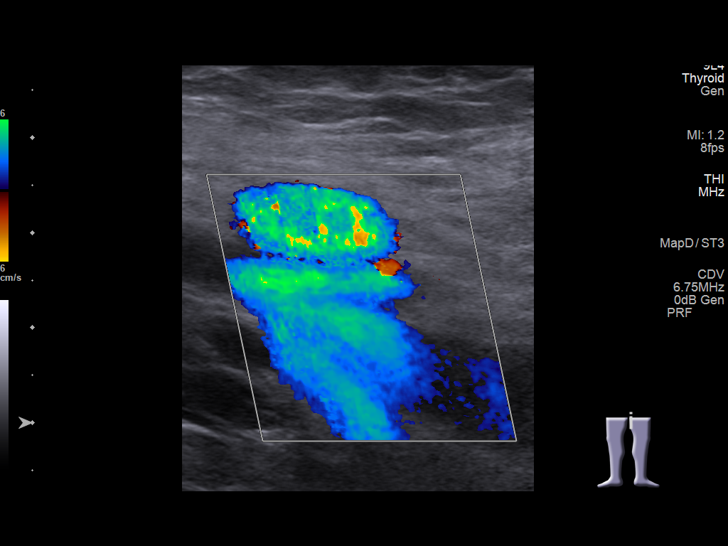
[im 41/56]
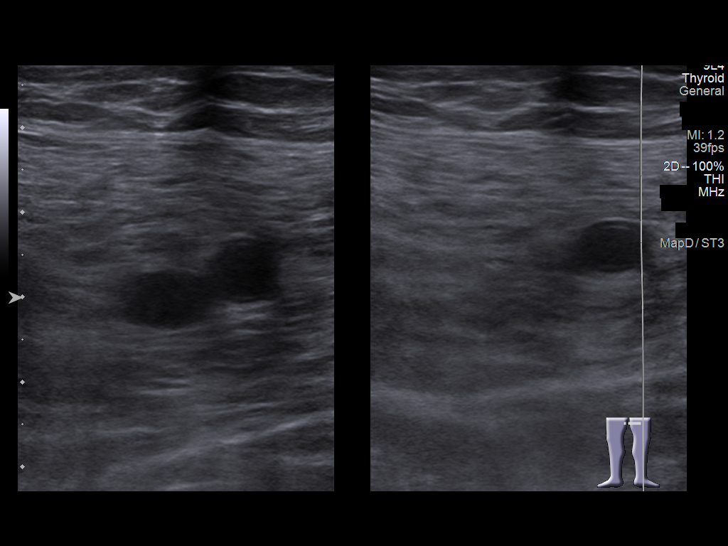
[im 46/56]
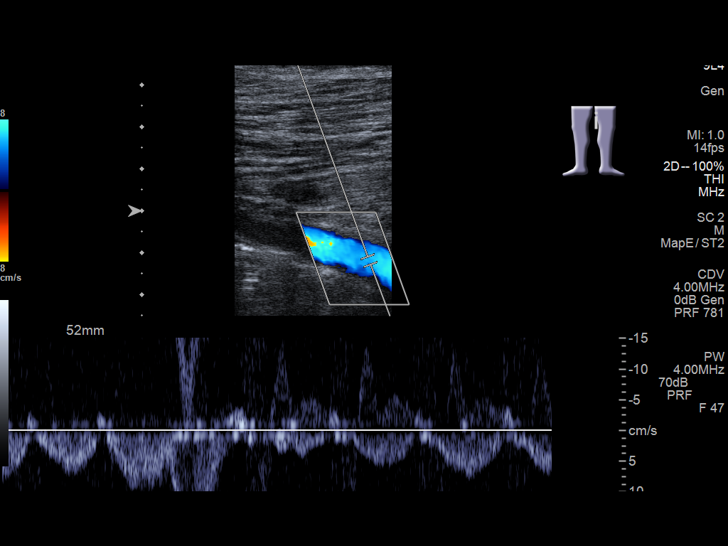
[im 51/56]
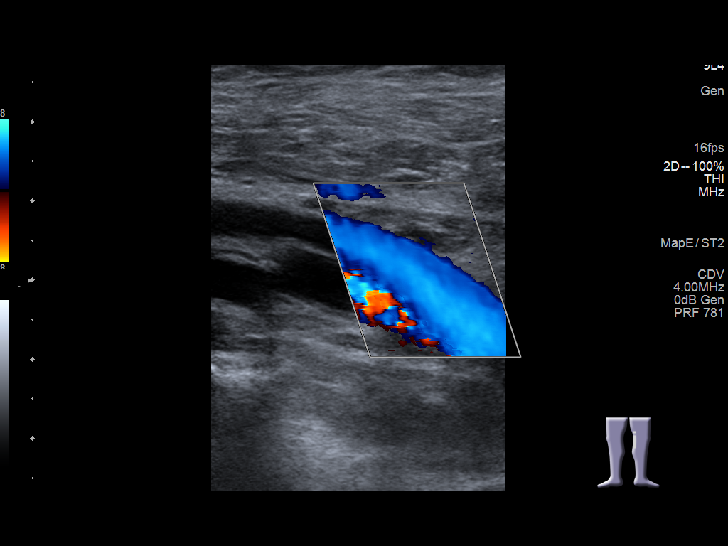
[im 56/56]
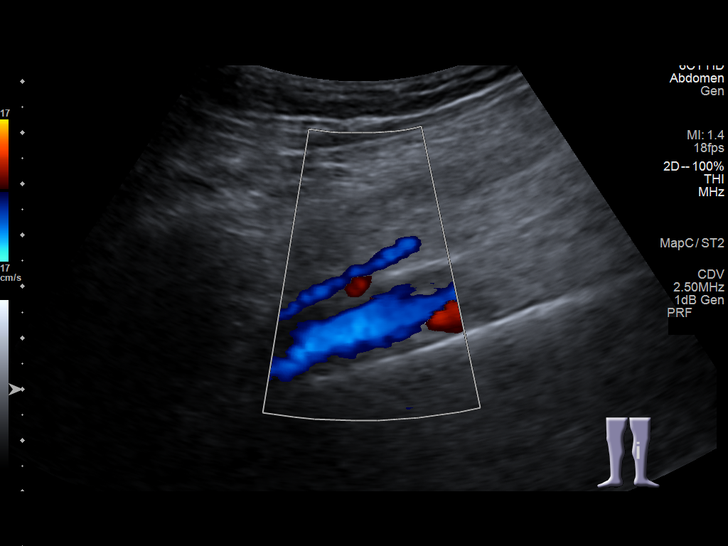

[13 of 24 positions shown; findings below may reference images not displayed]

FINDINGS: RIGHT LOWER EXTREMITY

Common Femoral Vein: No evidence of thrombus. Normal
compressibility, respiratory phasicity and response to augmentation.

Saphenofemoral Junction: No evidence of thrombus. Normal
compressibility and flow on color Doppler imaging.

Profunda Femoral Vein: No evidence of thrombus. Normal
compressibility and flow on color Doppler imaging.

Femoral Vein: No evidence of thrombus. Normal compressibility,
respiratory phasicity and response to augmentation.

Popliteal Vein: No evidence of thrombus. Normal compressibility,
respiratory phasicity and response to augmentation.

Calf Veins: No evidence of thrombus. Normal compressibility and flow
on color Doppler imaging.

Superficial Great Saphenous Vein: No evidence of thrombus. Normal
compressibility and flow on color Doppler imaging.

Other Findings:  None.

LEFT LOWER EXTREMITY

Common Femoral Vein: No evidence of thrombus. Normal
compressibility, respiratory phasicity and response to augmentation.

Saphenofemoral Junction: No evidence of thrombus. Normal
compressibility and flow on color Doppler imaging.

Profunda Femoral Vein: No evidence of thrombus. Normal
compressibility and flow on color Doppler imaging.

Femoral Vein: No evidence of thrombus. Normal compressibility,
respiratory phasicity and response to augmentation.

Popliteal Vein: No evidence of thrombus. Normal compressibility,
respiratory phasicity and response to augmentation.

Calf Veins: No evidence of thrombus. Normal compressibility and flow
on color Doppler imaging.

Superficial Great Saphenous Vein: No evidence of thrombus. Normal
compressibility and flow on color Doppler imaging.

Other Findings:  None.
IMPRESSION: Directed duplex bilateral lower extremities negative for DVT

## 2023-05-22 IMAGING — CR DG CHEST 2V
1 series · 2 of 2 positions shown · non-contrast
Comparison: 10/20/2007

CLINICAL DATA: Chest pain.

EXAM:
CHEST - 2 VIEW

[Series 1: dg chest 2 view · 0.14mm/px · 2 of 2 slices shown]
[im 1/2]
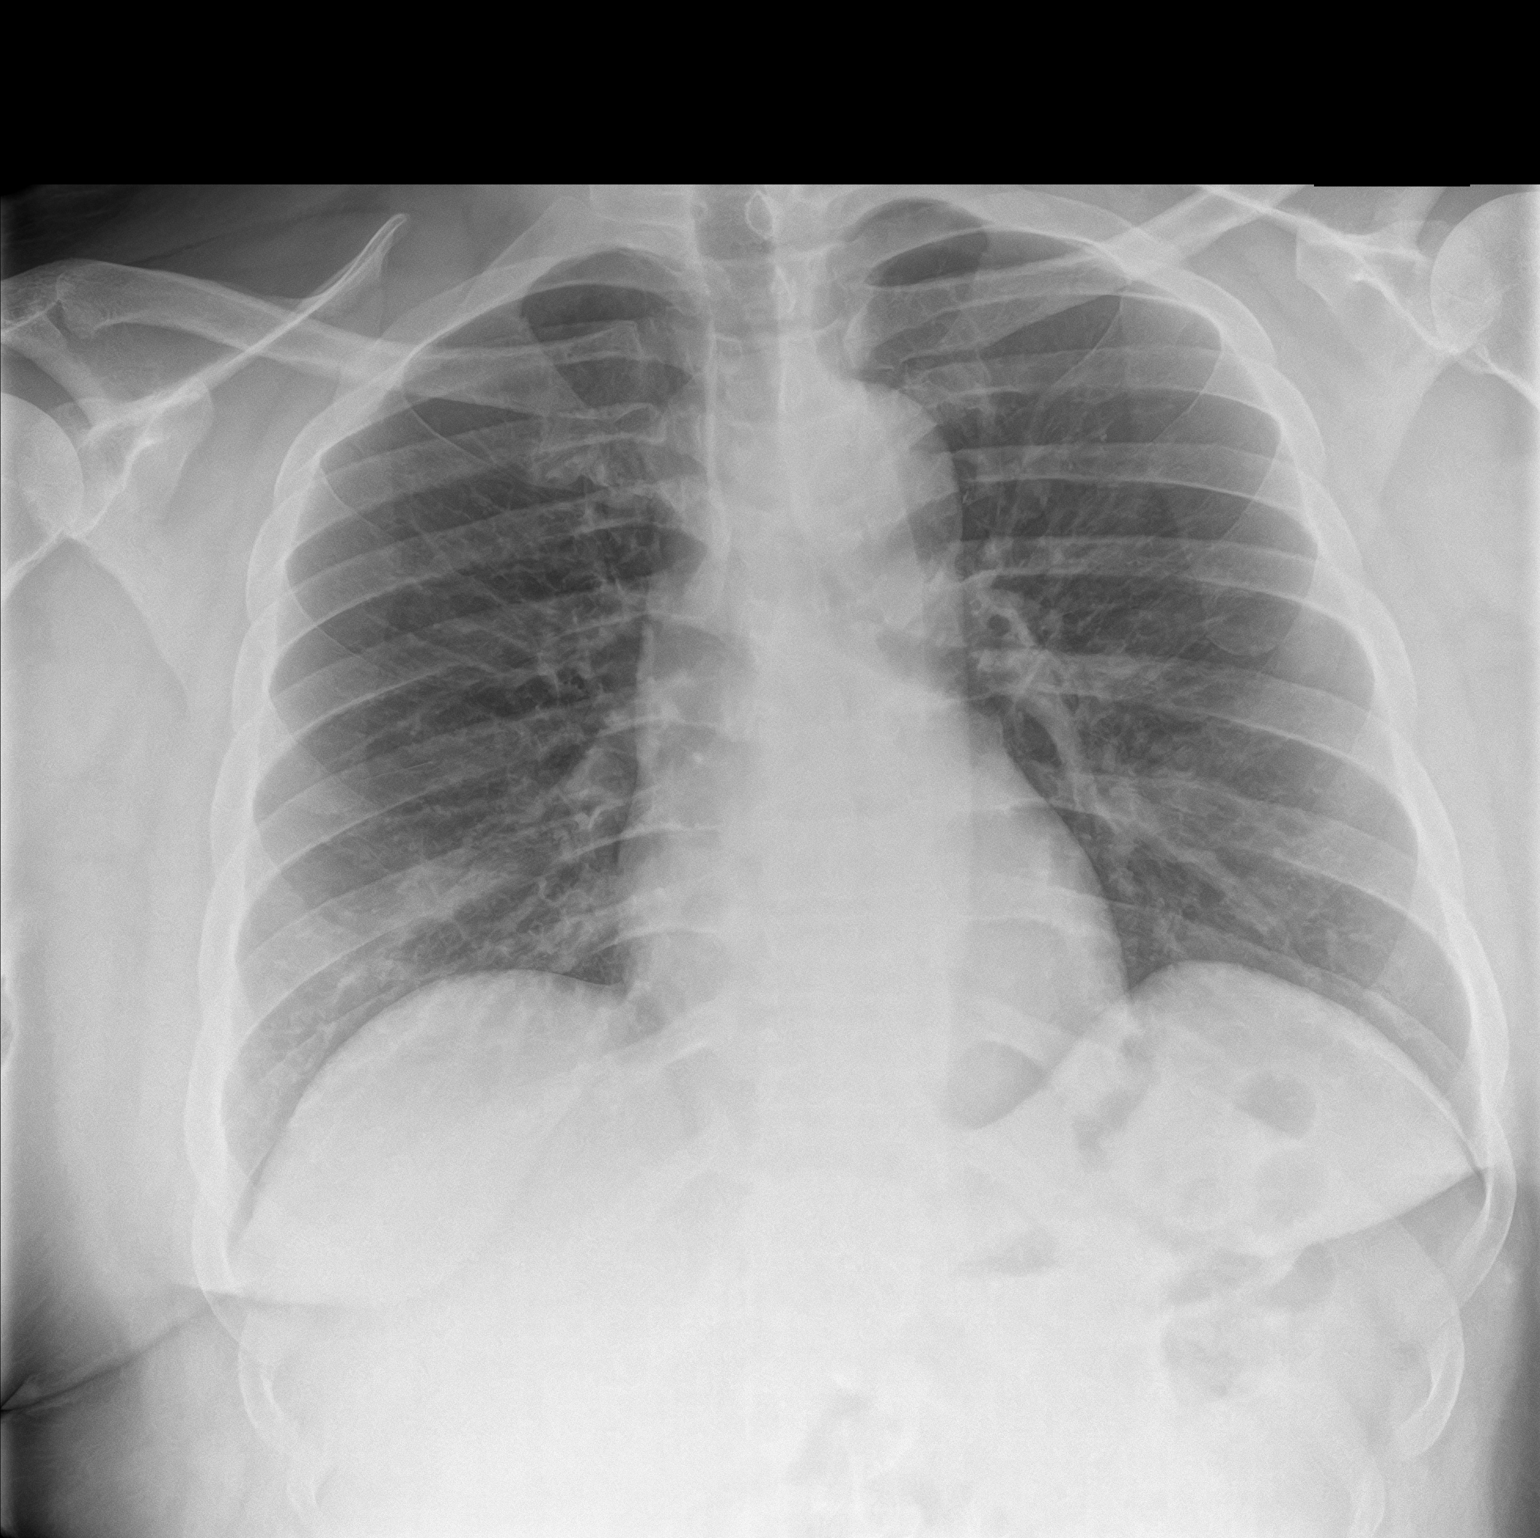
[im 2/2]
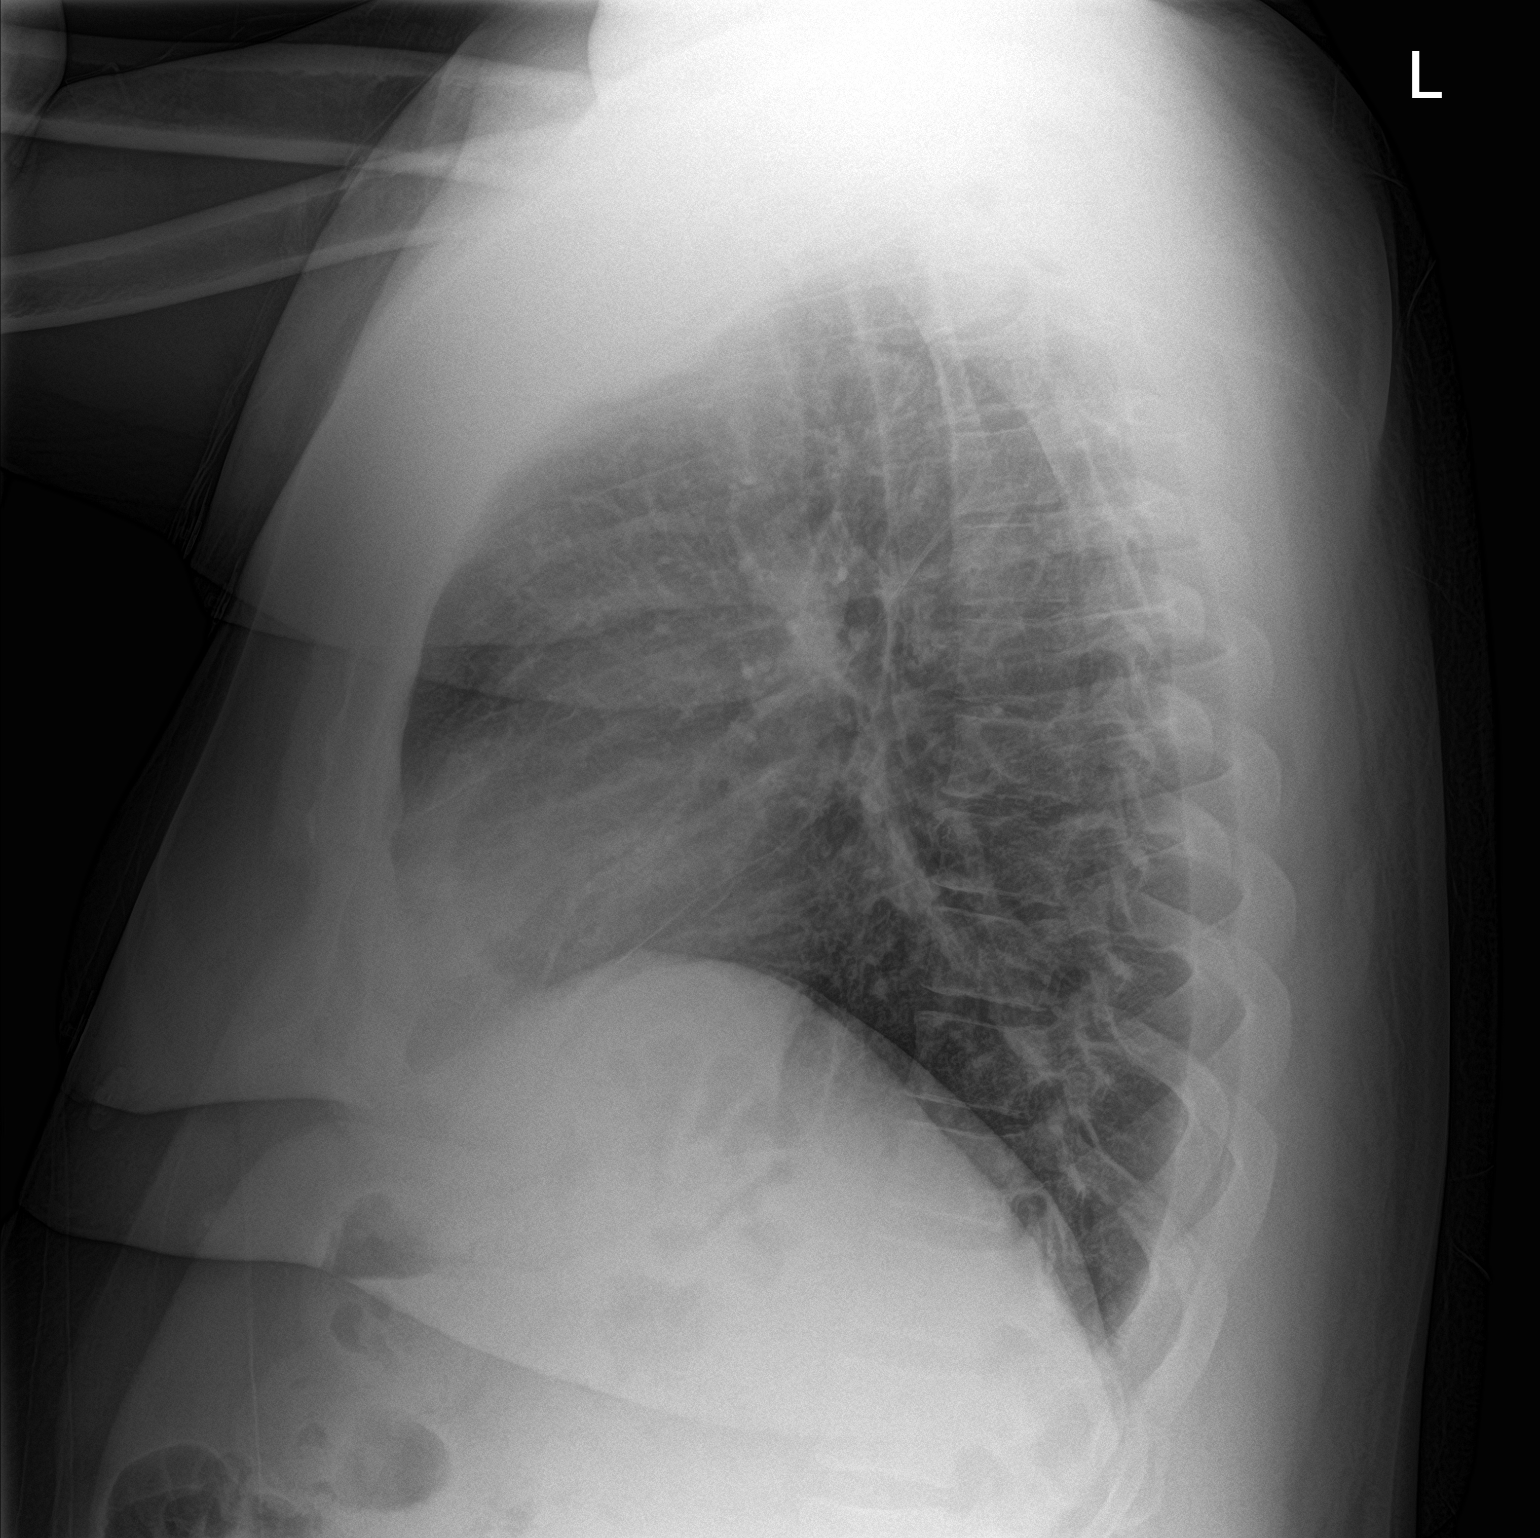

[2 of 2 positions shown; findings below may reference images not displayed]

FINDINGS: The heart size and mediastinal contours are within normal limits.
Both lungs are clear. The visualized skeletal structures are
unremarkable. Negative for a pneumothorax.
IMPRESSION: No active cardiopulmonary disease.
